# Patient Record
Sex: Female | Born: 1960
Health system: Southern US, Community
[De-identification: ages and names within clinical notes are randomized; demographics above are authoritative.]

## PROBLEM LIST (undated history)

## (undated) DIAGNOSIS — D649 Anemia, unspecified: Secondary | ICD-10-CM

## (undated) DIAGNOSIS — R609 Edema, unspecified: Secondary | ICD-10-CM

## (undated) DIAGNOSIS — F419 Anxiety disorder, unspecified: Secondary | ICD-10-CM

## (undated) DIAGNOSIS — R600 Localized edema: Secondary | ICD-10-CM

## (undated) DIAGNOSIS — E785 Hyperlipidemia, unspecified: Secondary | ICD-10-CM

## (undated) DIAGNOSIS — R7303 Prediabetes: Secondary | ICD-10-CM

## (undated) DIAGNOSIS — K219 Gastro-esophageal reflux disease without esophagitis: Secondary | ICD-10-CM

## (undated) DIAGNOSIS — I10 Essential (primary) hypertension: Secondary | ICD-10-CM

## (undated) DIAGNOSIS — F329 Major depressive disorder, single episode, unspecified: Secondary | ICD-10-CM

## (undated) DIAGNOSIS — F32A Depression, unspecified: Secondary | ICD-10-CM

## (undated) HISTORY — DX: Gastro-esophageal reflux disease without esophagitis: K21.9

## (undated) HISTORY — DX: Anxiety disorder, unspecified: F41.9

## (undated) HISTORY — DX: Edema, unspecified: R60.9

## (undated) HISTORY — DX: Prediabetes: R73.03

## (undated) HISTORY — DX: Depression, unspecified: F32.A

## (undated) HISTORY — DX: Essential (primary) hypertension: I10

## (undated) HISTORY — DX: Localized edema: R60.0

## (undated) HISTORY — DX: Hyperlipidemia, unspecified: E78.5

## (undated) HISTORY — DX: Major depressive disorder, single episode, unspecified: F32.9

## (undated) HISTORY — DX: Anemia, unspecified: D64.9

---

## 1998-12-19 ENCOUNTER — Other Ambulatory Visit: Admission: RE | Admit: 1998-12-19 | Discharge: 1998-12-19 | Payer: Self-pay | Admitting: Obstetrics & Gynecology

## 1999-06-07 ENCOUNTER — Ambulatory Visit (HOSPITAL_COMMUNITY): Admission: RE | Admit: 1999-06-07 | Discharge: 1999-06-07 | Payer: Self-pay | Admitting: *Deleted

## 1999-06-07 ENCOUNTER — Encounter: Payer: Self-pay | Admitting: *Deleted

## 1999-07-25 ENCOUNTER — Encounter: Payer: Self-pay | Admitting: *Deleted

## 1999-07-25 ENCOUNTER — Ambulatory Visit (HOSPITAL_COMMUNITY): Admission: RE | Admit: 1999-07-25 | Discharge: 1999-07-25 | Payer: Self-pay | Admitting: *Deleted

## 2004-05-23 ENCOUNTER — Other Ambulatory Visit: Admission: RE | Admit: 2004-05-23 | Discharge: 2004-05-23 | Payer: Self-pay | Admitting: Obstetrics & Gynecology

## 2004-10-04 ENCOUNTER — Inpatient Hospital Stay (HOSPITAL_COMMUNITY): Admission: EM | Admit: 2004-10-04 | Discharge: 2004-10-05 | Payer: Self-pay | Admitting: Family Medicine

## 2004-10-04 ENCOUNTER — Ambulatory Visit: Payer: Self-pay | Admitting: Cardiology

## 2011-04-11 ENCOUNTER — Inpatient Hospital Stay (HOSPITAL_COMMUNITY)
Admission: RE | Admit: 2011-04-11 | Discharge: 2011-04-15 | DRG: 430 | Disposition: A | Discharge status: Home / Self Care | Payer: BC Managed Care – PPO | Attending: Psychiatry | Admitting: Psychiatry

## 2011-04-11 DIAGNOSIS — F332 Major depressive disorder, recurrent severe without psychotic features: Principal | ICD-10-CM

## 2011-04-11 DIAGNOSIS — F411 Generalized anxiety disorder: Secondary | ICD-10-CM

## 2011-04-11 DIAGNOSIS — T424X4A Poisoning by benzodiazepines, undetermined, initial encounter: Secondary | ICD-10-CM

## 2011-04-11 DIAGNOSIS — Z79899 Other long term (current) drug therapy: Secondary | ICD-10-CM

## 2011-04-11 DIAGNOSIS — Z560 Unemployment, unspecified: Secondary | ICD-10-CM

## 2011-04-11 DIAGNOSIS — T394X2A Poisoning by antirheumatics, not elsewhere classified, intentional self-harm, initial encounter: Secondary | ICD-10-CM

## 2011-04-11 DIAGNOSIS — T40601A Poisoning by unspecified narcotics, accidental (unintentional), initial encounter: Secondary | ICD-10-CM

## 2011-04-11 DIAGNOSIS — Z880 Allergy status to penicillin: Secondary | ICD-10-CM

## 2011-04-11 DIAGNOSIS — I10 Essential (primary) hypertension: Secondary | ICD-10-CM

## 2011-04-11 DIAGNOSIS — T39314A Poisoning by propionic acid derivatives, undetermined, initial encounter: Secondary | ICD-10-CM

## 2011-04-11 DIAGNOSIS — K219 Gastro-esophageal reflux disease without esophagitis: Secondary | ICD-10-CM

## 2011-04-11 DIAGNOSIS — T43502A Poisoning by unspecified antipsychotics and neuroleptics, intentional self-harm, initial encounter: Secondary | ICD-10-CM

## 2011-04-12 DIAGNOSIS — F411 Generalized anxiety disorder: Secondary | ICD-10-CM

## 2011-04-12 DIAGNOSIS — F339 Major depressive disorder, recurrent, unspecified: Secondary | ICD-10-CM

## 2011-04-12 LAB — COMPREHENSIVE METABOLIC PANEL
ALT: 18 U/L (ref 0–35)
Albumin: 3.5 g/dL (ref 3.5–5.2)
Alkaline Phosphatase: 79 U/L (ref 39–117)
BUN: 13 mg/dL (ref 6–23)
Calcium: 9.3 mg/dL (ref 8.4–10.5)
Potassium: 4.5 mEq/L (ref 3.5–5.1)
Sodium: 141 mEq/L (ref 135–145)
Total Protein: 6.7 g/dL (ref 6.0–8.3)

## 2011-04-12 LAB — CBC
Platelets: 231 10*3/uL (ref 150–400)
RBC: 4.51 MIL/uL (ref 3.87–5.11)
RDW: 13.7 % (ref 11.5–15.5)
WBC: 7.1 10*3/uL (ref 4.0–10.5)

## 2011-04-12 LAB — DIFFERENTIAL
Basophils Relative: 1 % (ref 0–1)
Eosinophils Absolute: 0.2 10*3/uL (ref 0.0–0.7)
Neutrophils Relative %: 51 % (ref 43–77)

## 2011-04-12 LAB — ETHANOL: Alcohol, Ethyl (B): <11 mg/dL (ref 0–11)

## 2011-04-13 DIAGNOSIS — F411 Generalized anxiety disorder: Secondary | ICD-10-CM

## 2011-04-13 DIAGNOSIS — F339 Major depressive disorder, recurrent, unspecified: Secondary | ICD-10-CM

## 2011-04-13 LAB — ACETAMINOPHEN LEVEL: Acetaminophen (Tylenol), Serum: <15 ug/mL (ref 10–30)

## 2011-04-13 LAB — T3, FREE: T3, Free: 2.9 pg/mL (ref 2.3–4.2)

## 2011-04-15 ENCOUNTER — Inpatient Hospital Stay (INDEPENDENT_AMBULATORY_CARE_PROVIDER_SITE_OTHER)
Admission: RE | Admit: 2011-04-15 | Discharge: 2011-04-15 | Disposition: A | Discharge status: Home / Self Care | Payer: BC Managed Care – PPO | Source: Ambulatory Visit | Attending: Family Medicine | Admitting: Family Medicine

## 2011-04-15 ENCOUNTER — Encounter: Payer: Self-pay | Admitting: Family Medicine

## 2011-04-15 DIAGNOSIS — F3289 Other specified depressive episodes: Secondary | ICD-10-CM | POA: Insufficient documentation

## 2011-04-15 DIAGNOSIS — F329 Major depressive disorder, single episode, unspecified: Secondary | ICD-10-CM | POA: Insufficient documentation

## 2011-04-15 DIAGNOSIS — I10 Essential (primary) hypertension: Secondary | ICD-10-CM | POA: Insufficient documentation

## 2011-04-15 DIAGNOSIS — K219 Gastro-esophageal reflux disease without esophagitis: Secondary | ICD-10-CM | POA: Insufficient documentation

## 2011-04-15 DIAGNOSIS — L259 Unspecified contact dermatitis, unspecified cause: Secondary | ICD-10-CM

## 2011-04-15 DIAGNOSIS — L299 Pruritus, unspecified: Secondary | ICD-10-CM

## 2011-04-15 LAB — URINALYSIS, ROUTINE W REFLEX MICROSCOPIC
Glucose, UA: NEGATIVE mg/dL
Ketones, ur: NEGATIVE mg/dL
Leukocytes, UA: NEGATIVE
Nitrite: NEGATIVE
Protein, ur: NEGATIVE mg/dL

## 2011-04-15 LAB — PREGNANCY, URINE: Preg Test, Ur: NEGATIVE

## 2011-04-15 NOTE — Assessment & Plan Note (Signed)
NAME:  Alejandra Flores, Alejandra Flores NO.:  [id number]  MEDICAL RECORD NO.:  [id number]  LOCATION:  0505                          FACILITY:  [facility]  PHYSICIAN:  Franchot Gallo, MD     DATE OF BIRTH:  05-23-1961  DATE OF ADMISSION:  04/11/2011 DATE OF DISCHARGE:                      PSYCHIATRIC ADMISSION ASSESSMENT   IDENTIFYING INFORMATION:  This is a 50 year old female that was voluntarily admitted on April 12, 2011.  HISTORY OF PRESENT ILLNESS:  The patient was referred by her family doctor after the patient presented with having "crazy thoughts" in her head, reporting her mind racing and feeling exhausted.  The patient reports that she did overdose a couple of days back taking handfuls of Vicodin, Advil and Klonopin hoping not to wake up.  She states that what had happened, she woke up in her bed.  She states her husband did not take the overdose seriously.  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  She is still feeling suicidal and would feel unsafe at home.  She denies any psychotic symptoms.  She denies any substance use.  She was reporting feeling like she has a "good and bad [name]" with the bad [name] telling her to take pills.  PAST PSYCHIATRIC HISTORY:  First admission to [facility] She has had no other psychiatric admissions and is currently not in any outpatient therapy.  SOCIAL HISTORY:  She is a 50 year old married female.  She has 3 adult children.  She lives with her husband.  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  FAMILY HISTORY:  None.  ALCOHOL/DRUG HISTORY:  She denies any alcohol or substance use.  PRIMARY CARE PROVIDER:  Stacie Acres. Cliffton Asters, M.D. at [facility] physicians.  MEDICAL PROBLEMS: 1. History of hypertension. 2. GERD.  CURRENT MEDICATIONS:  From Dr. Lucilla Lame office list: 1. Estradiol 0.5 mg daily. 2.  Cymbalta 60 mg daily. 3. Klonopin 1 mg one-half to one tablet nightly p.r.n. 4. Lisinopril 10 mg daily. 5. Nexium 40 mg one daily. 6. Zegerid 40 mg on an empty stomach. 7. Amitriptyline 25 mg one nightly. 8. Vitamin D 50,000 units weekly.  ALLERGIES: 1. PENICILLIN with reaction of a rash. 2. PERCOCET with reaction of a rash, anaphylactic with morphine and     Dilaudid.  PHYSICAL EXAMINATION:  This is a well-nourished female in no acute distress.  She offers no complaints.  LABORATORY DATA:  No labs are available at this time.  MENTAL STATUS EXAM:  The patient is fully alert and cooperative.  She is dressed in her own clothing.  She is casually dressed.  She has good eye contact.  Her speech is clear, not rapid, not pressured.  She is very open about her circumstances that surrounded this admission.  When asked about the suicidal thoughts, she states "not really."  She promises safety on the unit, but definitely states she would not be safe at this time going home.  She is somewhat anxious.  Denies any psychotic symptoms and does not appear to be actively responding to any internal stimuli.  Cognitive function intact.  Judgment and insight are fair.  AXIS I:  Major depressive disorder, recurrent, severe. AXIS II:  Deferred. AXIS III:  History of hypertension.  Gastroesophageal reflux disease. AXIS IV:  Problems with occupation, legal system, other psychosocial problems. AXIS V:  Current is 30.  PLAN:  Our plan is to obtain labs especially in consideration of the recent overdose.  We will continue with her Cymbalta, her Nexium and her lisinopril.  We will also continue with her amitriptyline.  Increase her Cymbalta and add Trileptal.  We will contact husband for any safety issues at home.  The patient may benefit from some individual therapy as she also reports problems with marital discord at home and coping with her recent termination of her employment.  Her tentative length  of stay at this time is 4-5 days.     Landry Corporal, N.P.   ______________________________ Franchot Gallo, MD    JO/MEDQ  D:  04/12/2011  T:  04/12/2011  Job:  [id number]  Electronically Signed by Limmie PatriciaP. on 04/15/2011 09:22:26 AM Electronically Signed by Franchot Gallo MD on 04/15/2011 05:28:45 PM

## 2011-04-16 LAB — URINE DRUGS OF ABUSE SCREEN W ALC, ROUTINE (REF LAB)
Barbiturate Quant, Ur: NEGATIVE
Benzodiazepines.: NEGATIVE
Cocaine Metabolites: NEGATIVE
Creatinine,U: 25.9 mg/dL
Ethyl Alcohol: <10 mg/dL (ref <10)
Opiate Screen, Urine: NEGATIVE
Phencyclidine (PCP): NEGATIVE

## 2011-04-17 NOTE — Discharge Summary (Signed)
NAME:  Alejandra Flores, Alejandra Flores NO.:  [id number]  MEDICAL RECORD NO.:  [id number]  LOCATION:  0505                          FACILITY:  [facility]  PHYSICIAN:  Franchot Gallo, MD     DATE OF BIRTH:  [birth year]  DATE OF ADMISSION:  04/11/2011 DATE OF DISCHARGE:  04/15/2011                              DISCHARGE SUMMARY   REASON FOR ADMISSION:  This is a 50 year old female that was admitted after the patient was referred by her family doctor.  She was reporting her mind racing, feeling exhausted, and she reported an overdose a couple days prior taking handfuls of Vicodin, Advil and Klonopin hoping not to wake.  FINAL IMPRESSION:  Axis I: 1. Major depressive disorder, recurrent, mild. 2. Generalized anxiety disorder, mild. Axis II:  Deferred. Axis III:  Gastroesophageal reflux disease and hypertension. Axis IV:  Family conflict, occupational issues. Axis V:  GAF at discharge is 70.  SIGNIFICANT FINDINGS:  The patient was admitted to the adult milieu for safety and stabilization.  We continued with her Cymbalta, Nexium and lisinopril and had her amitriptyline available at bedtime.  We were going to increase her Cymbalta to lessen her depressive symptoms and add a mood stabilizer, Trileptal.  The patient was reporting problems with sleep, having frequent awakenings, decreased appetite, but that was improving.  She was reporting severe depressive symptoms rating it a 10 on a scale of 1-10 and her hopelessness a 10 on a scale of 1-10.  She was having severe anxiety rating it an 8 and having no medication side effects.  She was beginning to feel better.  She was having no thoughts of any self-harm and denied any psychotic symptoms, feeling more like herself.  She was participating in groups.  She talked about her suicide thinking due to her job loss and other personal stressors.  Her husband had attended the family forum, and her sister stated that she would be a support to her  when the patient was discharged.  We did have contact with Rocky Link, the patient's husband, to assess any safety issues and for [place] to provide suicide prevention, intervention and risk factors. On the day of discharge, the patient reported it was difficult to fall asleep.  Her appetite was less.  She was having mild depressive symptoms rating it a 2 on a scale of 1-10.  She denied any auditory hallucinations or delusional thinking, rating her anxiety mild, a 2 on a scale of 1-10.  DISCHARGE MEDICATIONS:  Her discharge medications included: 1. Cymbalta, taking 2 in the morning and 1 at 2 o'clock. 2. Trileptal 300 mg, 1 b.i.d. 3. Amitriptyline 25 mg nightly. 4. Estrace 0.5 mg 1 daily. 5. Lisinopril 10 mg daily. 6. Nexium 40 mg daily.  FOLLOWUP:  Her follow-up appointment was at [facility], phone number [phone].  The patient will be receiving a phone call from the case manager at home for her follow-up appointment.     Landry Corporal, N.P.   ______________________________ Franchot Gallo, MD  JO/MEDQ  D:  04/15/2011  T:  04/15/2011  Job:  [id number]  Electronically Signed by Limmie PatriciaP. on 04/16/2011 09:55:00 AM Electronically Signed  by Franchot Gallo MD on 04/17/2011 01:19:10 PM

## 2011-07-15 NOTE — Progress Notes (Signed)
Summary: ITCHING rash (room 4)   Vital Signs:  Patient Profile:   50 Years Old Female CC:      scattered rash Height:     60 inches Weight:      218 pounds O2 Sat:      100 % O2 treatment:    Room Air Temp:     98.4 degrees F oral Pulse rate:   104 / minute Resp:     16 per minute BP sitting:   103 / 68  (left arm) Cuff size:   large  Pt. in pain?   no  Vitals Entered By: Lavell Islam RN (April 15, 2011 4:13 PM)                   Updated Prior Medication List: CYMBALTA 30 MG CPEP (DULOXETINE HCL) daily TRILEPTAL 300 MG TABS (OXCARBAZEPINE) daily AMITRIPTYLINE HCL 25 MG TABS (AMITRIPTYLINE HCL) daily ESTRACE 0.5 MG TABS (ESTRADIOL) daily PRINIVIL 10 MG TABS (LISINOPRIL) daily NEXIUM 20 MG CPDR (ESOMEPRAZOLE MAGNESIUM) daily * BENADRYL 50MG  every 4 hours for rash/itching  Current Allergies: ! PENICILLIN ! * HYDROMORPHONE ! MORPHINE ! * OXYCODONE/ASAHistory of Present Illness Chief Complaint: scattered rash History of Present Illness:  Subjective:  Patient complains of onset of pruritic rash on her right thigh about one week ago after her dog pawed the area.  The dog had been playing in the neighbor's yard where poison ivy is located.  The rash has persisted without improvement from 1% HC cream.  Over the past two days she has developed mild erythema on her upper chest and itching in scalp and neck.  She had chills last night.  She feels well otherwise.  REVIEW OF SYSTEMS Constitutional Symptoms       Complains of fatigue.     Denies fever, chills, night sweats, weight loss, and weight gain.  Eyes       Denies change in vision, eye pain, eye discharge, glasses, contact lenses, and eye surgery. Ear/Nose/Throat/Mouth       Complains of ear pain.      Denies hearing loss/aids, change in hearing, ear discharge, dizziness, frequent runny nose, frequent nose bleeds, sinus problems, sore throat, hoarseness, and tooth pain or bleeding.  Respiratory       Denies dry  cough, productive cough, wheezing, shortness of breath, asthma, bronchitis, and emphysema/COPD.  Cardiovascular       Denies murmurs, chest pain, and tires easily with exhertion.    Gastrointestinal       Denies stomach pain, nausea/vomiting, diarrhea, constipation, blood in bowel movements, and indigestion. Genitourniary       Denies painful urination, kidney stones, and loss of urinary control. Neurological       Denies paralysis, seizures, and fainting/blackouts. Musculoskeletal       Complains of redness and swelling.      Denies muscle pain, joint pain, joint stiffness, decreased range of motion, muscle weakness, and gout.  Skin       Denies bruising, unusual mles/lumps or sores, and hair/skin or nail changes.  Psych       Complains of depression.      Denies mood changes, temper/anger issues, anxiety/stress, speech problems, and sleep problems. Other Comments: scattered rash; just [facility]   Past History:  Past Medical History: Depression GERD Hypertension  Past Surgical History: ankle Hysterectomy/partial Cholecystectomy  Family History: unremarkable  Social History: married Never Smoked Alcohol use-yes Drug use-no Smoking Status:  never Drug Use:  no   Objective:  Appearance:  Patient is obese but otherwise appears healthy, stated age, and in no acute distress  Eyes:  Pupils are equal, round, and reactive to light and accomodation.  Extraocular movement is intact.  Conjunctivae are not inflamed.  Ears:  Canals normal.  Tympanic membranes normal.   Nose:  Mildly congested turbinates.  No sinus tenderness  Mouth/pharynx:  No lesions Neck:  Supple.  No adenopathy is present.  Lungs:  Clear to auscultation.  Breath sounds are equal.  Heart:  Regular rate and rhythm without murmurs, rubs, or gallops.  Abdomen:  Nontender without masses or hepatosplenomegaly.  Bowel sounds are present.  No CVA or flank tenderness.  Skin:  Right lateral  thigh reveals several distinct linear erythematous slightly raised lesions about 15cm long that suggest scratches from a dog's paw.  No vesicles or swelling.  On the upper exposed chest on light exposed area is faint confluent erythema.  There are several scattered erythematous macular patches on neck. CBC:  WBC 9.6 ; LY 21.7, MO 2.4, GR 75.9; Hgb 12.3  Assessment New Problems: PRURITUS (ICD-698.9) CONTACT DERMATITIS (ICD-692.9) HYPERTENSION (ICD-401.9) GERD (ICD-530.81) DEPRESSION (ICD-311)  SUSPECT A CONTACT DERMATITIS WITH SECONDARY BACTERIAL INFECTION  Plan New Medications/Changes: CEPHALEXIN 500 MG CAPS (CEPHALEXIN) One by mouth two times a day  #14 x 0, 04/15/2011, Donna Christen MD DEXPAK 6 DAY 1.5 MG TABS (DEXAMETHASONE) Take as directed.  Start Tuesday  #1 x 0, 04/15/2011, Donna Christen MD  New Orders: CBC w/Diff [[id number]] Depo- Medrol 80mg  [J1040] Admin of Therapeutic Inj  intramuscular or subcutaneous [96372] New Patient Level IV [99204] Services provided After hours-Weekends-Holidays [99051] Planning Comments:   Depo Medrol 80mg  IM.  Begin 6 day DexPak tomorrow.  Begin Keflex.  May continue Benadryl at bedtime for itching. Follow-up with dermatologist if not improving, or if symptoms worsen   The patient and/or caregiver has been counseled thoroughly with regard to medications prescribed including dosage, schedule, interactions, rationale for use, and possible side effects and they verbalize understanding.  Diagnoses and expected course of recovery discussed and will return if not improved as expected or if the condition worsens. Patient and/or caregiver verbalized understanding.  Prescriptions: CEPHALEXIN 500 MG CAPS (CEPHALEXIN) One by mouth two times a day  #14 x 0   Entered and Authorized by:   Donna Christen MD   Signed by:   Donna Christen MD on 04/15/2011   Method used:   Print then Give to Patient   RxID:   [id number] DEXPAK 6 DAY 1.5 MG TABS  (DEXAMETHASONE) Take as directed.  Start Tuesday  #1 x 0   Entered and Authorized by:   Donna Christen MD   Signed by:   Donna Christen MD on 04/15/2011   Method used:   Print then Give to Patient   RxID:   [id number]   Medication Administration  Injection # 1:    Medication: Depo- Medrol 80mg     Diagnosis: PRURITUS (ICD-698.9)    Route: IM    Site: RUOQ gluteus    Exp Date: 07/2011    Lot #: [id number]    Mfr: novaplus    Patient tolerated injection without complications    Given by: Lavell Islam RN (April 15, 2011 5:04 PM)  Orders Added: 1)  CBC w/Diff [[id number]] 2)  Depo- Medrol 80mg  [J1040] 3)  Admin of Therapeutic Inj  intramuscular or subcutaneous [96372] 4)  New Patient Level IV [99204] 5)  Services provided After hours-Weekends-Holidays [72536]

## 2013-11-23 ENCOUNTER — Ambulatory Visit: Payer: BC Managed Care – PPO | Admitting: *Deleted

## 2013-11-23 ENCOUNTER — Encounter: Payer: Self-pay | Admitting: *Deleted

## 2013-11-23 ENCOUNTER — Encounter: Payer: BC Managed Care – PPO | Attending: Family Medicine | Admitting: *Deleted

## 2013-11-23 VITALS — Ht 60.0 in | Wt 228.4 lb

## 2013-11-23 DIAGNOSIS — F329 Major depressive disorder, single episode, unspecified: Secondary | ICD-10-CM | POA: Insufficient documentation

## 2013-11-23 DIAGNOSIS — F3289 Other specified depressive episodes: Secondary | ICD-10-CM | POA: Insufficient documentation

## 2013-11-23 DIAGNOSIS — E119 Type 2 diabetes mellitus without complications: Secondary | ICD-10-CM | POA: Insufficient documentation

## 2013-11-23 DIAGNOSIS — Z713 Dietary counseling and surveillance: Secondary | ICD-10-CM | POA: Insufficient documentation

## 2013-11-23 NOTE — Patient Instructions (Addendum)
Plan:  Aim for 2-3 Carb Choices per meal (30-45 grams) +/- 1 either way  Aim for 0-15 Carbs per snack if hungry  Include protein in moderation with your meals and snacks Consider reading food labels for Total Carbohydrate and Fat Grams of foods Consider  increasing your activity level by walking for 30 minutes daily as tolerated Continue checking BG  per day as directed by MD   Consider [facility] Protein Bars Consider [place] Bacon Consider reduced calorie bread (45-50cal) for less carbohydrates Alejandra Flores(Alejandra Flores, [place])  Have protein with all carbohydrates

## 2013-11-23 NOTE — Progress Notes (Signed)
Appt start time: 1100 end time:  1230.  Assessment:  Patient was seen on  11/23/13 for individual diabetes education. This is a new diagnosis 07/23/13. She has had a 8# weight loss since diagnosis. Alejandra Flores has a diagnosis of depression which is being treatment and managed effectively.  Current HbA1c: 6.7%  Preferred Learning Style:   Auditory  Visual  Hands on  Learning Readiness:   Change in progress  MEDICATIONS: None for diabetes at this time  Usual physical activity: walking  Intervention:  Nutrition counseling provided.  Discussed diabetes disease process and treatment options.  Discussed physiology of diabetes and role of obesity on insulin resistance.  Encouraged moderate weight reduction to improve glucose levels.  Discussed role of medications and diet in glucose control  Provided education on macronutrients on glucose levels.  Provided education on carb counting, importance of regularly scheduled meals/snacks, and meal planning  Discussed effects of physical activity on glucose levels and long-term glucose control.  Recommended 150 minutes of physical activity/week.  Reviewed patient medications.  Discussed role of medication on blood glucose and possible side effects  Discussed blood glucose monitoring and interpretation.  Discussed recommended target ranges and individual ranges.    Described short-term complications: hyper- and hypo-glycemia.  Discussed causes,symptoms, and treatment options.  Discussed prevention, detection, and treatment of long-term complications.  Discussed the role of prolonged elevated glucose levels on body systems.  Discussed role of stress on blood glucose levels and discussed strategies to manage psychosocial issues.  Discussed recommendations for long-term diabetes self-care.  Established checklist for medical, dental, and emotional self-care.  Plan:  Aim for 2-3 Carb Choices per meal (30-45 grams) +/- 1 either way  Aim for 0-15 Carbs  per snack if hungry  Include protein in moderation with your meals and snacks Consider reading food labels for Total Carbohydrate and Fat Grams of foods Consider  increasing your activity level by walking for 30 minutes daily as tolerated Continue checking BG  per day as directed by MD   Consider [facility] Protein Bars Consider Alejandra Flores Consider reduced calorie bread (45-50cal) for less carbohydrates Alejandra Flores, [place])  Have protein with all carbohydrates  Teaching Method Utilized:  Visual Auditory Hands on  Handouts given during visit include: Living Well with Diabetes Carb Counting  Meal Plan Card Snack sheet  Barriers to learning/adherence to lifestyle change: Depression diagnosis  Diabetes self-care support plan:   Alejandra Flores support group  PCP  Daughter-in-law  Demonstrated degree of understanding via:  Teach Back   Monitoring/Evaluation:  Dietary intake, exercise, test glucose, and body weight prn.

## 2016-07-18 ENCOUNTER — Other Ambulatory Visit: Payer: Self-pay | Admitting: Orthopedic Surgery

## 2016-08-08 ENCOUNTER — Ambulatory Visit: Admit: 2016-08-08 | Payer: Self-pay | Admitting: Orthopedic Surgery

## 2016-08-08 SURGERY — ANTERIOR CERVICAL DECOMPRESSION/DISCECTOMY FUSION 3 LEVELS
Anesthesia: General

## 2018-10-22 DIAGNOSIS — R69 Illness, unspecified: Secondary | ICD-10-CM | POA: Diagnosis not present

## 2018-11-14 DIAGNOSIS — G8929 Other chronic pain: Secondary | ICD-10-CM | POA: Diagnosis not present

## 2018-11-14 DIAGNOSIS — R269 Unspecified abnormalities of gait and mobility: Secondary | ICD-10-CM | POA: Diagnosis not present

## 2018-11-14 DIAGNOSIS — M47892 Other spondylosis, cervical region: Secondary | ICD-10-CM | POA: Diagnosis not present

## 2018-11-14 DIAGNOSIS — M5441 Lumbago with sciatica, right side: Secondary | ICD-10-CM | POA: Diagnosis not present

## 2018-11-14 DIAGNOSIS — M545 Low back pain: Secondary | ICD-10-CM | POA: Diagnosis not present

## 2018-11-14 DIAGNOSIS — M542 Cervicalgia: Secondary | ICD-10-CM | POA: Diagnosis not present

## 2018-11-18 DIAGNOSIS — M4802 Spinal stenosis, cervical region: Secondary | ICD-10-CM | POA: Diagnosis not present

## 2018-11-18 DIAGNOSIS — M542 Cervicalgia: Secondary | ICD-10-CM | POA: Diagnosis not present

## 2018-11-18 DIAGNOSIS — M9983 Other biomechanical lesions of lumbar region: Secondary | ICD-10-CM | POA: Diagnosis not present

## 2018-11-19 DIAGNOSIS — E782 Mixed hyperlipidemia: Secondary | ICD-10-CM | POA: Diagnosis not present

## 2018-11-19 DIAGNOSIS — E119 Type 2 diabetes mellitus without complications: Secondary | ICD-10-CM | POA: Diagnosis not present

## 2018-11-27 DIAGNOSIS — M4712 Other spondylosis with myelopathy, cervical region: Secondary | ICD-10-CM | POA: Diagnosis not present

## 2018-11-27 DIAGNOSIS — M542 Cervicalgia: Secondary | ICD-10-CM | POA: Diagnosis not present

## 2018-11-27 DIAGNOSIS — M9983 Other biomechanical lesions of lumbar region: Secondary | ICD-10-CM | POA: Diagnosis not present

## 2018-11-27 DIAGNOSIS — M5417 Radiculopathy, lumbosacral region: Secondary | ICD-10-CM | POA: Diagnosis not present

## 2018-11-27 DIAGNOSIS — M533 Sacrococcygeal disorders, not elsewhere classified: Secondary | ICD-10-CM | POA: Diagnosis not present

## 2018-11-27 DIAGNOSIS — M4802 Spinal stenosis, cervical region: Secondary | ICD-10-CM | POA: Diagnosis not present

## 2018-12-02 DIAGNOSIS — D720 Genetic anomalies of leukocytes: Secondary | ICD-10-CM | POA: Diagnosis not present

## 2018-12-02 DIAGNOSIS — D729 Disorder of white blood cells, unspecified: Secondary | ICD-10-CM | POA: Diagnosis not present

## 2018-12-02 DIAGNOSIS — D72829 Elevated white blood cell count, unspecified: Secondary | ICD-10-CM | POA: Diagnosis not present

## 2018-12-02 DIAGNOSIS — M479 Spondylosis, unspecified: Secondary | ICD-10-CM | POA: Diagnosis not present

## 2018-12-02 DIAGNOSIS — Z791 Long term (current) use of non-steroidal anti-inflammatories (NSAID): Secondary | ICD-10-CM | POA: Diagnosis not present

## 2018-12-30 DIAGNOSIS — M545 Low back pain: Secondary | ICD-10-CM | POA: Diagnosis not present

## 2018-12-30 DIAGNOSIS — G8929 Other chronic pain: Secondary | ICD-10-CM | POA: Diagnosis not present

## 2018-12-30 DIAGNOSIS — M5416 Radiculopathy, lumbar region: Secondary | ICD-10-CM | POA: Diagnosis not present

## 2018-12-30 DIAGNOSIS — M5417 Radiculopathy, lumbosacral region: Secondary | ICD-10-CM | POA: Diagnosis not present

## 2019-01-22 DIAGNOSIS — R69 Illness, unspecified: Secondary | ICD-10-CM | POA: Diagnosis not present

## 2019-02-24 DIAGNOSIS — M5126 Other intervertebral disc displacement, lumbar region: Secondary | ICD-10-CM | POA: Diagnosis not present

## 2019-04-22 DIAGNOSIS — G4733 Obstructive sleep apnea (adult) (pediatric): Secondary | ICD-10-CM | POA: Diagnosis not present

## 2019-05-05 DIAGNOSIS — M5416 Radiculopathy, lumbar region: Secondary | ICD-10-CM | POA: Diagnosis not present

## 2019-06-04 DIAGNOSIS — M5136 Other intervertebral disc degeneration, lumbar region: Secondary | ICD-10-CM | POA: Diagnosis not present

## 2019-06-04 DIAGNOSIS — Z6841 Body Mass Index (BMI) 40.0 and over, adult: Secondary | ICD-10-CM | POA: Diagnosis not present

## 2019-06-25 DIAGNOSIS — M5126 Other intervertebral disc displacement, lumbar region: Secondary | ICD-10-CM | POA: Diagnosis not present

## 2019-06-25 DIAGNOSIS — M4807 Spinal stenosis, lumbosacral region: Secondary | ICD-10-CM | POA: Diagnosis not present

## 2019-06-25 DIAGNOSIS — M5127 Other intervertebral disc displacement, lumbosacral region: Secondary | ICD-10-CM | POA: Diagnosis not present

## 2019-07-02 DIAGNOSIS — M533 Sacrococcygeal disorders, not elsewhere classified: Secondary | ICD-10-CM | POA: Diagnosis not present

## 2019-07-02 DIAGNOSIS — Z6841 Body Mass Index (BMI) 40.0 and over, adult: Secondary | ICD-10-CM | POA: Diagnosis not present

## 2019-07-05 ENCOUNTER — Other Ambulatory Visit: Payer: Self-pay | Admitting: Orthopedic Surgery

## 2019-07-05 DIAGNOSIS — M533 Sacrococcygeal disorders, not elsewhere classified: Secondary | ICD-10-CM

## 2019-07-15 ENCOUNTER — Other Ambulatory Visit: Payer: Self-pay

## 2019-07-16 ENCOUNTER — Ambulatory Visit
Admission: RE | Admit: 2019-07-16 | Discharge: 2019-07-16 | Disposition: A | Discharge status: Home / Self Care | Payer: 59 | Source: Ambulatory Visit | Attending: Orthopedic Surgery | Admitting: Orthopedic Surgery

## 2019-07-16 DIAGNOSIS — R102 Pelvic and perineal pain: Secondary | ICD-10-CM | POA: Diagnosis not present

## 2019-07-16 DIAGNOSIS — M533 Sacrococcygeal disorders, not elsewhere classified: Secondary | ICD-10-CM

## 2019-07-23 DIAGNOSIS — M533 Sacrococcygeal disorders, not elsewhere classified: Secondary | ICD-10-CM | POA: Diagnosis not present

## 2019-07-23 DIAGNOSIS — Z6841 Body Mass Index (BMI) 40.0 and over, adult: Secondary | ICD-10-CM | POA: Diagnosis not present

## 2019-08-02 DIAGNOSIS — M47818 Spondylosis without myelopathy or radiculopathy, sacral and sacrococcygeal region: Secondary | ICD-10-CM | POA: Diagnosis not present

## 2019-08-02 DIAGNOSIS — M47816 Spondylosis without myelopathy or radiculopathy, lumbar region: Secondary | ICD-10-CM | POA: Diagnosis not present

## 2019-08-16 DIAGNOSIS — M533 Sacrococcygeal disorders, not elsewhere classified: Secondary | ICD-10-CM | POA: Diagnosis not present

## 2019-08-18 DIAGNOSIS — G4733 Obstructive sleep apnea (adult) (pediatric): Secondary | ICD-10-CM | POA: Diagnosis not present

## 2019-09-14 DIAGNOSIS — M461 Sacroiliitis, not elsewhere classified: Secondary | ICD-10-CM | POA: Diagnosis not present

## 2019-10-08 DIAGNOSIS — Z9889 Other specified postprocedural states: Secondary | ICD-10-CM | POA: Diagnosis not present

## 2019-10-08 DIAGNOSIS — M533 Sacrococcygeal disorders, not elsewhere classified: Secondary | ICD-10-CM | POA: Diagnosis not present

## 2019-10-15 DIAGNOSIS — M545 Low back pain: Secondary | ICD-10-CM | POA: Diagnosis not present

## 2019-10-15 DIAGNOSIS — R262 Difficulty in walking, not elsewhere classified: Secondary | ICD-10-CM | POA: Diagnosis not present

## 2019-10-18 DIAGNOSIS — M545 Low back pain: Secondary | ICD-10-CM | POA: Diagnosis not present

## 2019-10-18 DIAGNOSIS — R262 Difficulty in walking, not elsewhere classified: Secondary | ICD-10-CM | POA: Diagnosis not present

## 2019-10-22 DIAGNOSIS — M545 Low back pain: Secondary | ICD-10-CM | POA: Diagnosis not present

## 2019-10-22 DIAGNOSIS — R262 Difficulty in walking, not elsewhere classified: Secondary | ICD-10-CM | POA: Diagnosis not present

## 2019-10-25 DIAGNOSIS — R262 Difficulty in walking, not elsewhere classified: Secondary | ICD-10-CM | POA: Diagnosis not present

## 2019-10-25 DIAGNOSIS — M545 Low back pain: Secondary | ICD-10-CM | POA: Diagnosis not present

## 2019-10-28 DIAGNOSIS — R262 Difficulty in walking, not elsewhere classified: Secondary | ICD-10-CM | POA: Diagnosis not present

## 2019-10-28 DIAGNOSIS — M545 Low back pain: Secondary | ICD-10-CM | POA: Diagnosis not present

## 2019-10-29 DIAGNOSIS — Z9889 Other specified postprocedural states: Secondary | ICD-10-CM | POA: Diagnosis not present

## 2019-10-29 DIAGNOSIS — M533 Sacrococcygeal disorders, not elsewhere classified: Secondary | ICD-10-CM | POA: Diagnosis not present

## 2019-11-02 DIAGNOSIS — R262 Difficulty in walking, not elsewhere classified: Secondary | ICD-10-CM | POA: Diagnosis not present

## 2019-11-02 DIAGNOSIS — M545 Low back pain: Secondary | ICD-10-CM | POA: Diagnosis not present

## 2019-11-03 DIAGNOSIS — M722 Plantar fascial fibromatosis: Secondary | ICD-10-CM | POA: Diagnosis not present

## 2019-11-04 DIAGNOSIS — M545 Low back pain: Secondary | ICD-10-CM | POA: Diagnosis not present

## 2019-11-04 DIAGNOSIS — R262 Difficulty in walking, not elsewhere classified: Secondary | ICD-10-CM | POA: Diagnosis not present

## 2019-11-17 DIAGNOSIS — M545 Low back pain: Secondary | ICD-10-CM | POA: Diagnosis not present

## 2019-11-17 DIAGNOSIS — R262 Difficulty in walking, not elsewhere classified: Secondary | ICD-10-CM | POA: Diagnosis not present

## 2019-11-18 DIAGNOSIS — G4733 Obstructive sleep apnea (adult) (pediatric): Secondary | ICD-10-CM | POA: Diagnosis not present

## 2019-11-19 DIAGNOSIS — R262 Difficulty in walking, not elsewhere classified: Secondary | ICD-10-CM | POA: Diagnosis not present

## 2019-11-19 DIAGNOSIS — M545 Low back pain: Secondary | ICD-10-CM | POA: Diagnosis not present

## 2019-11-22 DIAGNOSIS — M545 Low back pain: Secondary | ICD-10-CM | POA: Diagnosis not present

## 2019-11-22 DIAGNOSIS — R262 Difficulty in walking, not elsewhere classified: Secondary | ICD-10-CM | POA: Diagnosis not present

## 2019-11-24 DIAGNOSIS — M545 Low back pain: Secondary | ICD-10-CM | POA: Diagnosis not present

## 2019-11-24 DIAGNOSIS — R262 Difficulty in walking, not elsewhere classified: Secondary | ICD-10-CM | POA: Diagnosis not present

## 2019-11-29 DIAGNOSIS — R262 Difficulty in walking, not elsewhere classified: Secondary | ICD-10-CM | POA: Diagnosis not present

## 2019-11-29 DIAGNOSIS — M545 Low back pain: Secondary | ICD-10-CM | POA: Diagnosis not present

## 2019-11-30 DIAGNOSIS — H40053 Ocular hypertension, bilateral: Secondary | ICD-10-CM | POA: Diagnosis not present

## 2019-11-30 DIAGNOSIS — E119 Type 2 diabetes mellitus without complications: Secondary | ICD-10-CM | POA: Diagnosis not present

## 2019-11-30 DIAGNOSIS — H2513 Age-related nuclear cataract, bilateral: Secondary | ICD-10-CM | POA: Diagnosis not present

## 2019-12-01 DIAGNOSIS — R262 Difficulty in walking, not elsewhere classified: Secondary | ICD-10-CM | POA: Diagnosis not present

## 2019-12-01 DIAGNOSIS — M545 Low back pain: Secondary | ICD-10-CM | POA: Diagnosis not present

## 2019-12-10 DIAGNOSIS — M533 Sacrococcygeal disorders, not elsewhere classified: Secondary | ICD-10-CM | POA: Diagnosis not present

## 2020-02-24 DIAGNOSIS — E782 Mixed hyperlipidemia: Secondary | ICD-10-CM | POA: Diagnosis not present

## 2020-02-24 DIAGNOSIS — I10 Essential (primary) hypertension: Secondary | ICD-10-CM | POA: Diagnosis not present

## 2020-02-24 DIAGNOSIS — E119 Type 2 diabetes mellitus without complications: Secondary | ICD-10-CM | POA: Diagnosis not present

## 2020-03-24 DIAGNOSIS — R69 Illness, unspecified: Secondary | ICD-10-CM | POA: Diagnosis not present

## 2020-03-24 DIAGNOSIS — Z79899 Other long term (current) drug therapy: Secondary | ICD-10-CM | POA: Diagnosis not present

## 2020-04-24 DIAGNOSIS — M47816 Spondylosis without myelopathy or radiculopathy, lumbar region: Secondary | ICD-10-CM | POA: Diagnosis not present

## 2020-04-24 DIAGNOSIS — M5136 Other intervertebral disc degeneration, lumbar region: Secondary | ICD-10-CM | POA: Diagnosis not present

## 2020-04-24 DIAGNOSIS — M533 Sacrococcygeal disorders, not elsewhere classified: Secondary | ICD-10-CM | POA: Diagnosis not present

## 2020-04-24 DIAGNOSIS — G4733 Obstructive sleep apnea (adult) (pediatric): Secondary | ICD-10-CM | POA: Diagnosis not present

## 2020-04-24 DIAGNOSIS — Z9989 Dependence on other enabling machines and devices: Secondary | ICD-10-CM | POA: Diagnosis not present

## 2020-04-24 DIAGNOSIS — M5416 Radiculopathy, lumbar region: Secondary | ICD-10-CM | POA: Diagnosis not present

## 2020-05-21 ENCOUNTER — Telehealth (HOSPITAL_COMMUNITY): Payer: Self-pay | Admitting: Nurse Practitioner

## 2020-05-21 DIAGNOSIS — U071 COVID-19: Secondary | ICD-10-CM

## 2020-05-21 NOTE — Telephone Encounter (Signed)
Called to Discuss with patient about Covid symptoms and the use of regeneron, a monoclonal antibody infusion for those with mild to moderate Covid symptoms and at a high risk of hospitalization.     Pt is qualified for this infusion at the [facility] due to co-morbid conditions and/or a member of an at-risk group.     Unable to reach pt. Left message to return call  Kaceton Vieau, DNP, AGNP-C [phone] (Infusion Center Hotline)  

## 2020-05-22 ENCOUNTER — Ambulatory Visit (HOSPITAL_COMMUNITY): Payer: No Typology Code available for payment source

## 2020-05-23 ENCOUNTER — Ambulatory Visit (HOSPITAL_COMMUNITY)
Admission: RE | Admit: 2020-05-23 | Discharge: 2020-05-23 | Disposition: A | Discharge status: Home / Self Care | Payer: No Typology Code available for payment source | Source: Ambulatory Visit | Attending: Pulmonary Disease | Admitting: Pulmonary Disease

## 2020-05-23 DIAGNOSIS — U071 COVID-19: Secondary | ICD-10-CM | POA: Diagnosis not present

## 2020-05-23 MED ORDER — ALBUTEROL SULFATE HFA 108 (90 BASE) MCG/ACT IN AERS: 2.0000 | INHALATION_SPRAY | Freq: Once | RESPIRATORY_TRACT | Status: DC | PRN

## 2020-05-23 MED ORDER — METHYLPREDNISOLONE SODIUM SUCC 125 MG IJ SOLR: 125.0000 mg | Freq: Once | INTRAMUSCULAR | Status: DC | PRN

## 2020-05-23 MED ORDER — DIPHENHYDRAMINE HCL 50 MG/ML IJ SOLN: 50.0000 mg | Freq: Once | INTRAMUSCULAR | Status: DC | PRN

## 2020-05-23 MED ORDER — EPINEPHRINE 0.3 MG/0.3ML IJ SOAJ: 0.3000 mg | Freq: Once | INTRAMUSCULAR | Status: DC | PRN

## 2020-05-23 MED ORDER — FAMOTIDINE IN NACL 20-0.9 MG/50ML-% IV SOLN: 20.0000 mg | Freq: Once | INTRAVENOUS | Status: DC | PRN

## 2020-05-23 MED ORDER — SODIUM CHLORIDE 0.9 % IV SOLN: INTRAVENOUS | Status: DC | PRN

## 2020-05-23 MED ORDER — SODIUM CHLORIDE 0.9 % IV SOLN
Freq: Once | INTRAVENOUS | Status: AC
  Filled 2020-05-23: qty 20

## 2020-05-23 NOTE — Discharge Instructions (Signed)

## 2020-05-23 NOTE — Progress Notes (Signed)
  Diagnosis: COVID-19  Physician: Patrick Wright, MD  Procedure: Cov[facility] Infusion Clinic Med: bamlanivimab\etesevimab infusion - Provided patient with bamlanimivab\etesevimab fact sheet for patients, parents and caregivers prior to infusion.  Complications: No immediate complications noted.  Discharge: Discharged home   Elonzo Sopp N Rogina Schiano 05/23/2020   

## 2020-05-23 NOTE — Progress Notes (Signed)
I connected by phone with Alejandra Flores on 05/23/2020 at 8:32 AM to discuss the potential use of a new treatment for mild to moderate COVID-19 viral infection in non-hospitalized patients.  This patient is a 59 y.o. female that meets the FDA criteria for Emergency Use Authorization of COVID monoclonal antibody casirivimab/imdevimab or bamlanivimab/eteseviamb.  Has a (+) direct SARS-CoV-2 viral test result  Has mild or moderate COVID-19   Is NOT hospitalized due to COVID-19  Is within 10 days of symptom onset  Has at least one of the high risk factor(s) for progression to severe COVID-19 and/or hospitalization as defined in EUA.  Specific high risk criteria : BMI > 25 and Cardiovascular disease or hypertension   I have spoken and communicated the following to the patient or parent/caregiver regarding COVID monoclonal antibody treatment:  1. FDA has authorized the emergency use for the treatment of mild to moderate COVID-19 in adults and pediatric patients with positive results of direct SARS-CoV-2 viral testing who are 56 years of age and older weighing at least 40 kg, and who are at high risk for progressing to severe COVID-19 and/or hospitalization.  2. The significant known and potential risks and benefits of COVID monoclonal antibody, and the extent to which such potential risks and benefits are unknown.  3. Information on available alternative treatments and the risks and benefits of those alternatives, including clinical trials.  4. Patients treated with COVID monoclonal antibody should continue to self-isolate and use infection control measures (e.g., wear mask, isolate, social distance, avoid sharing personal items, clean and disinfect "high touch" surfaces, and frequent handwashing) according to CDC guidelines.   5. The patient or parent/caregiver has the option to accept or refuse COVID monoclonal antibody treatment.  After reviewing this information with the patient, the  patient has agreed to receive one of the available covid 19 monoclonal antibodies and will be provided an appropriate fact sheet prior to infusion. [name], NP 05/23/2020 8:32 AM   '

## 2020-05-26 DIAGNOSIS — Z981 Arthrodesis status: Secondary | ICD-10-CM | POA: Diagnosis not present

## 2020-05-26 DIAGNOSIS — M47818 Spondylosis without myelopathy or radiculopathy, sacral and sacrococcygeal region: Secondary | ICD-10-CM | POA: Diagnosis not present

## 2020-05-26 DIAGNOSIS — M47816 Spondylosis without myelopathy or radiculopathy, lumbar region: Secondary | ICD-10-CM | POA: Diagnosis not present

## 2020-05-26 DIAGNOSIS — M5137 Other intervertebral disc degeneration, lumbosacral region: Secondary | ICD-10-CM | POA: Diagnosis not present

## 2020-05-26 DIAGNOSIS — M47817 Spondylosis without myelopathy or radiculopathy, lumbosacral region: Secondary | ICD-10-CM | POA: Diagnosis not present

## 2020-05-26 DIAGNOSIS — M5416 Radiculopathy, lumbar region: Secondary | ICD-10-CM | POA: Diagnosis not present

## 2020-05-26 DIAGNOSIS — M5127 Other intervertebral disc displacement, lumbosacral region: Secondary | ICD-10-CM | POA: Diagnosis not present

## 2020-05-26 DIAGNOSIS — M533 Sacrococcygeal disorders, not elsewhere classified: Secondary | ICD-10-CM | POA: Diagnosis not present

## 2020-05-26 DIAGNOSIS — Z9989 Dependence on other enabling machines and devices: Secondary | ICD-10-CM | POA: Diagnosis not present

## 2020-05-26 DIAGNOSIS — G4733 Obstructive sleep apnea (adult) (pediatric): Secondary | ICD-10-CM | POA: Diagnosis not present

## 2020-05-28 DIAGNOSIS — Z20822 Contact with and (suspected) exposure to covid-19: Secondary | ICD-10-CM | POA: Diagnosis not present

## 2020-06-01 DIAGNOSIS — E119 Type 2 diabetes mellitus without complications: Secondary | ICD-10-CM | POA: Diagnosis not present

## 2020-06-01 DIAGNOSIS — U071 COVID-19: Secondary | ICD-10-CM | POA: Diagnosis not present

## 2020-06-16 DIAGNOSIS — R69 Illness, unspecified: Secondary | ICD-10-CM | POA: Diagnosis not present

## 2020-06-16 DIAGNOSIS — Z79899 Other long term (current) drug therapy: Secondary | ICD-10-CM | POA: Diagnosis not present

## 2020-06-26 DIAGNOSIS — Z9989 Dependence on other enabling machines and devices: Secondary | ICD-10-CM | POA: Diagnosis not present

## 2020-06-26 DIAGNOSIS — G4733 Obstructive sleep apnea (adult) (pediatric): Secondary | ICD-10-CM | POA: Diagnosis not present

## 2020-07-26 DIAGNOSIS — Z9989 Dependence on other enabling machines and devices: Secondary | ICD-10-CM | POA: Diagnosis not present

## 2020-07-26 DIAGNOSIS — G4733 Obstructive sleep apnea (adult) (pediatric): Secondary | ICD-10-CM | POA: Diagnosis not present

## 2020-07-27 DIAGNOSIS — R110 Nausea: Secondary | ICD-10-CM | POA: Diagnosis not present

## 2020-07-27 DIAGNOSIS — R197 Diarrhea, unspecified: Secondary | ICD-10-CM | POA: Diagnosis not present

## 2020-07-28 DIAGNOSIS — Z9989 Dependence on other enabling machines and devices: Secondary | ICD-10-CM | POA: Diagnosis not present

## 2020-07-28 DIAGNOSIS — G4733 Obstructive sleep apnea (adult) (pediatric): Secondary | ICD-10-CM | POA: Diagnosis not present

## 2020-08-10 DIAGNOSIS — Z1231 Encounter for screening mammogram for malignant neoplasm of breast: Secondary | ICD-10-CM | POA: Diagnosis not present

## 2020-08-10 DIAGNOSIS — E782 Mixed hyperlipidemia: Secondary | ICD-10-CM | POA: Diagnosis not present

## 2020-08-10 DIAGNOSIS — L989 Disorder of the skin and subcutaneous tissue, unspecified: Secondary | ICD-10-CM | POA: Diagnosis not present

## 2020-08-10 DIAGNOSIS — E119 Type 2 diabetes mellitus without complications: Secondary | ICD-10-CM | POA: Diagnosis not present

## 2020-08-10 DIAGNOSIS — Z840 Family history of diseases of the skin and subcutaneous tissue: Secondary | ICD-10-CM | POA: Diagnosis not present

## 2020-08-10 DIAGNOSIS — I10 Essential (primary) hypertension: Secondary | ICD-10-CM | POA: Diagnosis not present

## 2020-08-11 DIAGNOSIS — Z23 Encounter for immunization: Secondary | ICD-10-CM | POA: Diagnosis not present

## 2020-08-23 DIAGNOSIS — Z1231 Encounter for screening mammogram for malignant neoplasm of breast: Secondary | ICD-10-CM | POA: Diagnosis not present

## 2020-08-26 DIAGNOSIS — Z9989 Dependence on other enabling machines and devices: Secondary | ICD-10-CM | POA: Diagnosis not present

## 2020-08-26 DIAGNOSIS — G4733 Obstructive sleep apnea (adult) (pediatric): Secondary | ICD-10-CM | POA: Diagnosis not present

## 2020-09-19 DIAGNOSIS — R69 Illness, unspecified: Secondary | ICD-10-CM | POA: Diagnosis not present

## 2020-09-26 DIAGNOSIS — G4733 Obstructive sleep apnea (adult) (pediatric): Secondary | ICD-10-CM | POA: Diagnosis not present

## 2020-09-26 DIAGNOSIS — Z9989 Dependence on other enabling machines and devices: Secondary | ICD-10-CM | POA: Diagnosis not present

## 2020-10-24 DIAGNOSIS — Z9989 Dependence on other enabling machines and devices: Secondary | ICD-10-CM | POA: Diagnosis not present

## 2020-10-24 DIAGNOSIS — G4733 Obstructive sleep apnea (adult) (pediatric): Secondary | ICD-10-CM | POA: Diagnosis not present

## 2020-11-02 DIAGNOSIS — Z0001 Encounter for general adult medical examination with abnormal findings: Secondary | ICD-10-CM | POA: Diagnosis not present

## 2020-11-02 DIAGNOSIS — I129 Hypertensive chronic kidney disease with stage 1 through stage 4 chronic kidney disease, or unspecified chronic kidney disease: Secondary | ICD-10-CM | POA: Diagnosis not present

## 2020-11-02 DIAGNOSIS — Z79899 Other long term (current) drug therapy: Secondary | ICD-10-CM | POA: Diagnosis not present

## 2020-11-02 DIAGNOSIS — E1122 Type 2 diabetes mellitus with diabetic chronic kidney disease: Secondary | ICD-10-CM | POA: Diagnosis not present

## 2020-11-02 DIAGNOSIS — Z7984 Long term (current) use of oral hypoglycemic drugs: Secondary | ICD-10-CM | POA: Diagnosis not present

## 2020-11-02 DIAGNOSIS — E538 Deficiency of other specified B group vitamins: Secondary | ICD-10-CM | POA: Diagnosis not present

## 2020-11-02 DIAGNOSIS — E782 Mixed hyperlipidemia: Secondary | ICD-10-CM | POA: Diagnosis not present

## 2020-11-02 DIAGNOSIS — D631 Anemia in chronic kidney disease: Secondary | ICD-10-CM | POA: Diagnosis not present

## 2020-11-02 DIAGNOSIS — N1831 Chronic kidney disease, stage 3a: Secondary | ICD-10-CM | POA: Diagnosis not present

## 2020-11-06 DIAGNOSIS — Z8711 Personal history of peptic ulcer disease: Secondary | ICD-10-CM | POA: Diagnosis not present

## 2020-11-06 DIAGNOSIS — E1122 Type 2 diabetes mellitus with diabetic chronic kidney disease: Secondary | ICD-10-CM | POA: Diagnosis not present

## 2020-11-06 DIAGNOSIS — R69 Illness, unspecified: Secondary | ICD-10-CM | POA: Diagnosis not present

## 2020-11-06 DIAGNOSIS — I129 Hypertensive chronic kidney disease with stage 1 through stage 4 chronic kidney disease, or unspecified chronic kidney disease: Secondary | ICD-10-CM | POA: Diagnosis not present

## 2020-11-06 DIAGNOSIS — E782 Mixed hyperlipidemia: Secondary | ICD-10-CM | POA: Diagnosis not present

## 2020-11-06 DIAGNOSIS — Z0000 Encounter for general adult medical examination without abnormal findings: Secondary | ICD-10-CM | POA: Diagnosis not present

## 2020-11-06 DIAGNOSIS — N1831 Chronic kidney disease, stage 3a: Secondary | ICD-10-CM | POA: Diagnosis not present

## 2020-11-06 DIAGNOSIS — K219 Gastro-esophageal reflux disease without esophagitis: Secondary | ICD-10-CM | POA: Diagnosis not present

## 2020-11-06 DIAGNOSIS — Z6841 Body Mass Index (BMI) 40.0 and over, adult: Secondary | ICD-10-CM | POA: Diagnosis not present

## 2020-11-06 DIAGNOSIS — G4733 Obstructive sleep apnea (adult) (pediatric): Secondary | ICD-10-CM | POA: Diagnosis not present

## 2020-11-06 DIAGNOSIS — Z23 Encounter for immunization: Secondary | ICD-10-CM | POA: Diagnosis not present

## 2020-11-24 DIAGNOSIS — Z9989 Dependence on other enabling machines and devices: Secondary | ICD-10-CM | POA: Diagnosis not present

## 2020-11-24 DIAGNOSIS — G4733 Obstructive sleep apnea (adult) (pediatric): Secondary | ICD-10-CM | POA: Diagnosis not present

## 2020-12-24 DIAGNOSIS — Z9989 Dependence on other enabling machines and devices: Secondary | ICD-10-CM | POA: Diagnosis not present

## 2020-12-24 DIAGNOSIS — G4733 Obstructive sleep apnea (adult) (pediatric): Secondary | ICD-10-CM | POA: Diagnosis not present

## 2021-01-03 DIAGNOSIS — Z79899 Other long term (current) drug therapy: Secondary | ICD-10-CM | POA: Diagnosis not present

## 2021-01-03 DIAGNOSIS — R69 Illness, unspecified: Secondary | ICD-10-CM | POA: Diagnosis not present

## 2021-01-22 DIAGNOSIS — J069 Acute upper respiratory infection, unspecified: Secondary | ICD-10-CM | POA: Diagnosis not present

## 2021-01-24 DIAGNOSIS — Z9989 Dependence on other enabling machines and devices: Secondary | ICD-10-CM | POA: Diagnosis not present

## 2021-01-24 DIAGNOSIS — G4733 Obstructive sleep apnea (adult) (pediatric): Secondary | ICD-10-CM | POA: Diagnosis not present

## 2021-02-08 DIAGNOSIS — M5417 Radiculopathy, lumbosacral region: Secondary | ICD-10-CM | POA: Diagnosis not present

## 2021-02-08 DIAGNOSIS — D649 Anemia, unspecified: Secondary | ICD-10-CM | POA: Diagnosis not present

## 2021-02-08 DIAGNOSIS — E538 Deficiency of other specified B group vitamins: Secondary | ICD-10-CM | POA: Diagnosis not present

## 2021-03-06 DIAGNOSIS — E119 Type 2 diabetes mellitus without complications: Secondary | ICD-10-CM | POA: Diagnosis not present

## 2021-03-06 DIAGNOSIS — H2513 Age-related nuclear cataract, bilateral: Secondary | ICD-10-CM | POA: Diagnosis not present

## 2021-03-06 DIAGNOSIS — H40053 Ocular hypertension, bilateral: Secondary | ICD-10-CM | POA: Diagnosis not present

## 2021-03-20 DIAGNOSIS — G4733 Obstructive sleep apnea (adult) (pediatric): Secondary | ICD-10-CM | POA: Diagnosis not present

## 2021-03-20 DIAGNOSIS — Z9989 Dependence on other enabling machines and devices: Secondary | ICD-10-CM | POA: Diagnosis not present

## 2021-03-28 DIAGNOSIS — Z79899 Other long term (current) drug therapy: Secondary | ICD-10-CM | POA: Diagnosis not present

## 2021-03-28 DIAGNOSIS — R69 Illness, unspecified: Secondary | ICD-10-CM | POA: Diagnosis not present

## 2021-04-20 DIAGNOSIS — Z9989 Dependence on other enabling machines and devices: Secondary | ICD-10-CM | POA: Diagnosis not present

## 2021-04-20 DIAGNOSIS — G4733 Obstructive sleep apnea (adult) (pediatric): Secondary | ICD-10-CM | POA: Diagnosis not present

## 2021-05-20 DIAGNOSIS — G4733 Obstructive sleep apnea (adult) (pediatric): Secondary | ICD-10-CM | POA: Diagnosis not present

## 2021-05-21 DIAGNOSIS — Z23 Encounter for immunization: Secondary | ICD-10-CM | POA: Diagnosis not present

## 2021-05-21 DIAGNOSIS — M5417 Radiculopathy, lumbosacral region: Secondary | ICD-10-CM | POA: Diagnosis not present

## 2021-05-21 DIAGNOSIS — E119 Type 2 diabetes mellitus without complications: Secondary | ICD-10-CM | POA: Diagnosis not present

## 2021-05-21 DIAGNOSIS — I10 Essential (primary) hypertension: Secondary | ICD-10-CM | POA: Diagnosis not present

## 2021-05-21 DIAGNOSIS — E782 Mixed hyperlipidemia: Secondary | ICD-10-CM | POA: Diagnosis not present

## 2021-05-21 DIAGNOSIS — R5383 Other fatigue: Secondary | ICD-10-CM | POA: Diagnosis not present

## 2021-05-21 DIAGNOSIS — D649 Anemia, unspecified: Secondary | ICD-10-CM | POA: Diagnosis not present

## 2021-05-21 DIAGNOSIS — E538 Deficiency of other specified B group vitamins: Secondary | ICD-10-CM | POA: Diagnosis not present

## 2021-05-25 IMAGING — CT CT BIOPSY
1 of 3 series · 13 of 32 positions shown, 19 images · non-contrast
Comparison: none

CLINICAL DATA: Right-sided pelvic pain. SI joint anesthetic
injection requested.

[Series 2: needle -guided injection · axial · 0.86mm/px · z∈[-120,-66]mm · 13 of 33 slices shown, 19 images]
[im 3/33  soft-tissue]
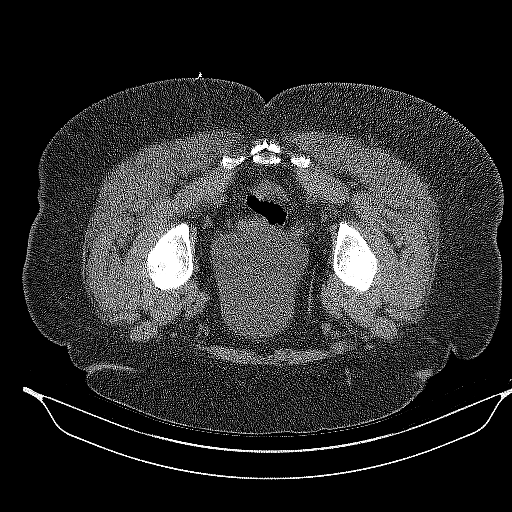
[im 3/33  bone]
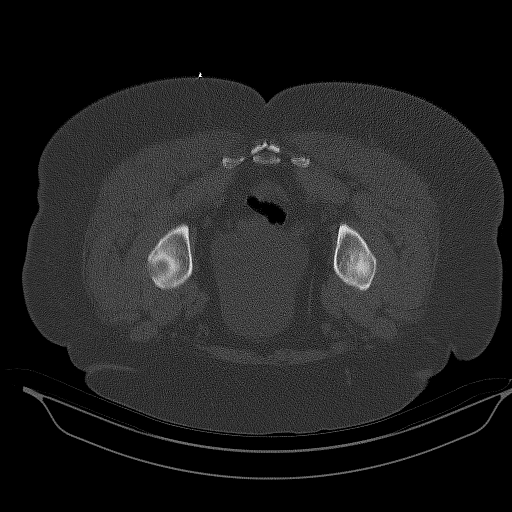
[im 5/33  soft-tissue]
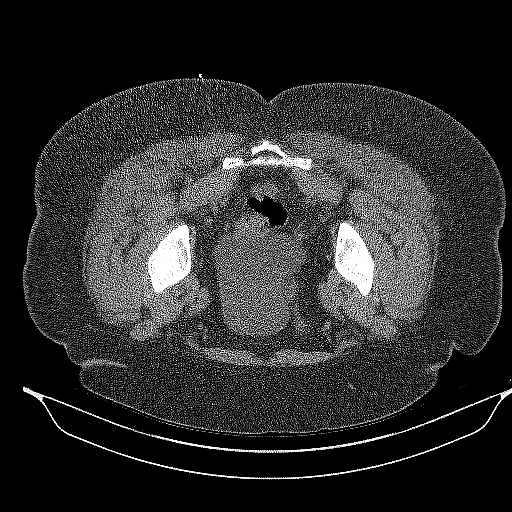
[im 7/33  soft-tissue]
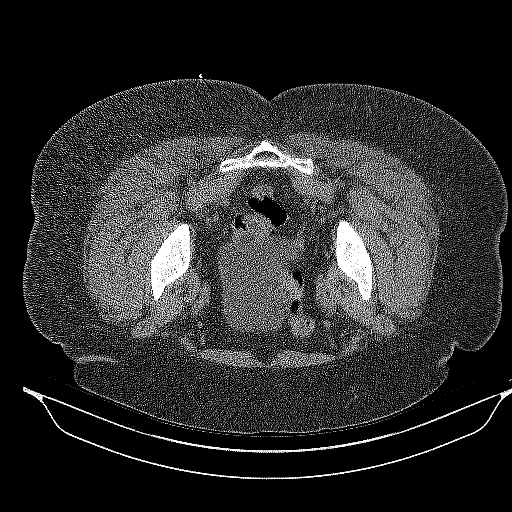
[im 9/33  soft-tissue]
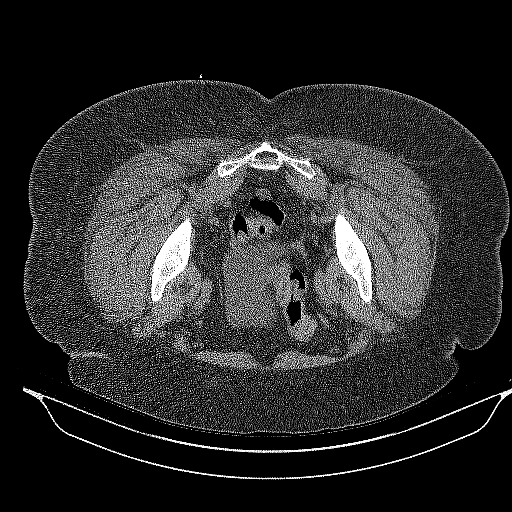
[im 11/33  soft-tissue]
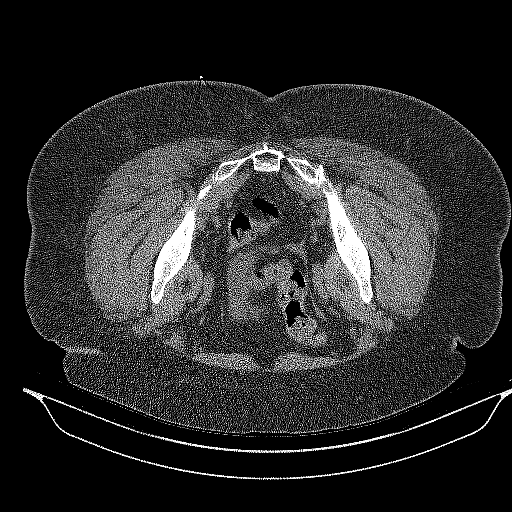
[im 13/33  soft-tissue]
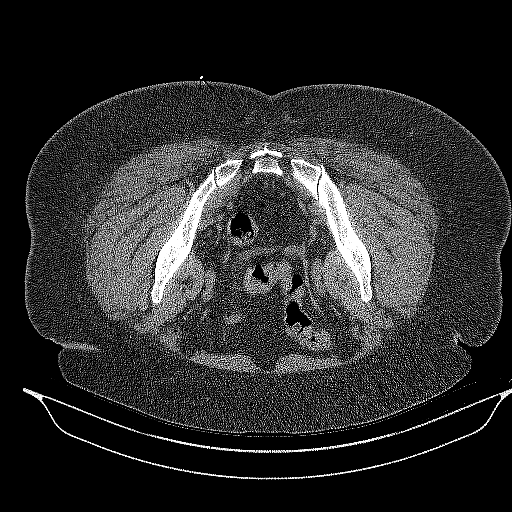
[im 18/33  soft-tissue]
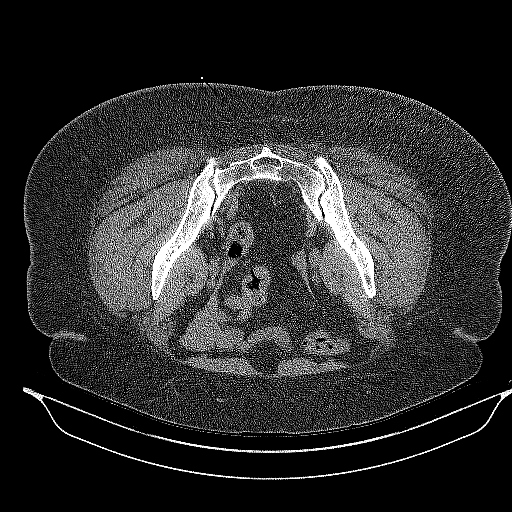
[im 20/33  soft-tissue]
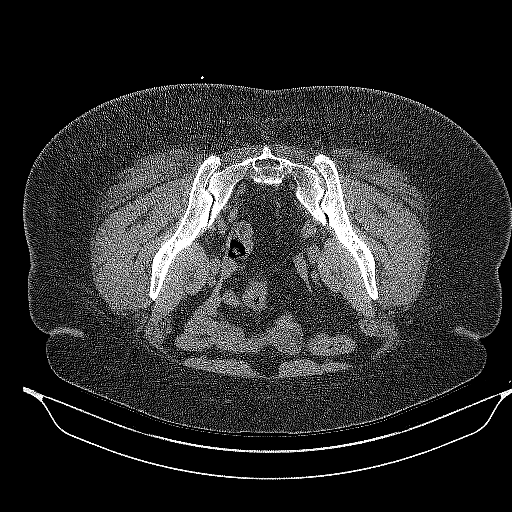
[im 22/33  soft-tissue]
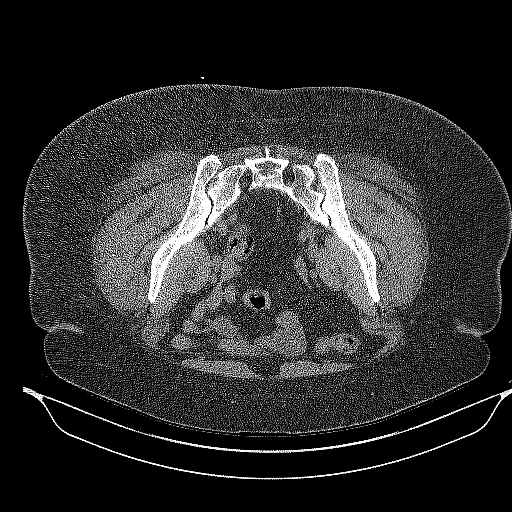
[im 22/33  bone]
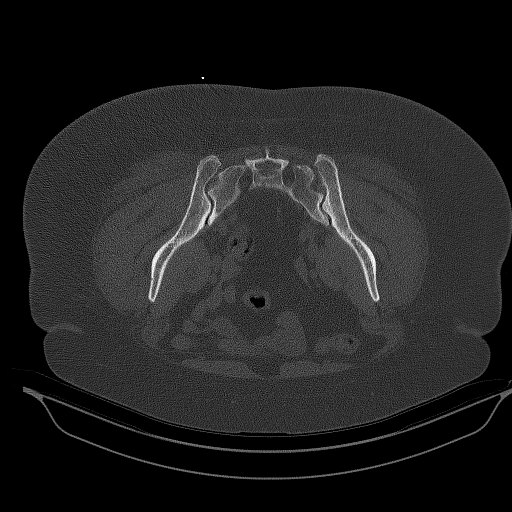
[im 24/33  soft-tissue]
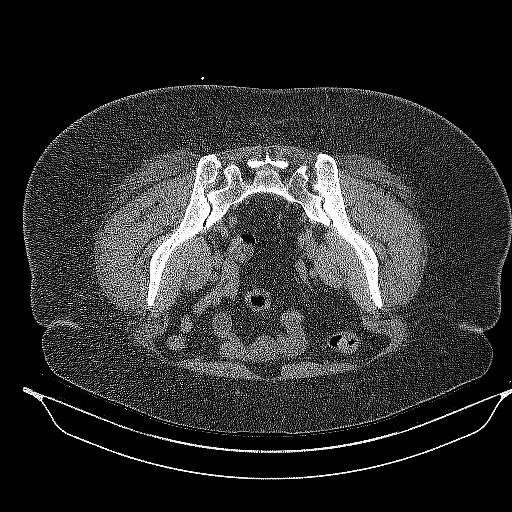
[im 24/33  lung]
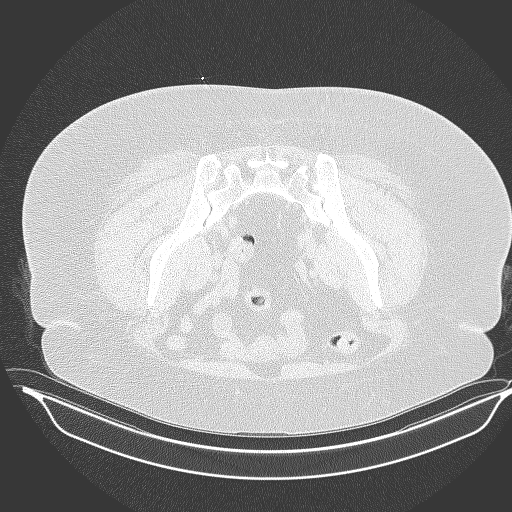
[im 26/33  soft-tissue]
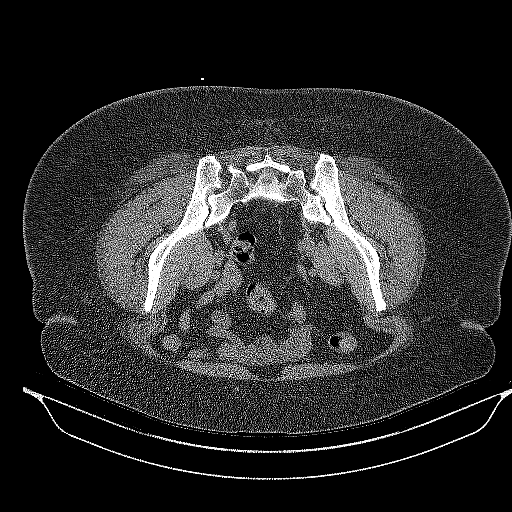
[im 26/33  lung]
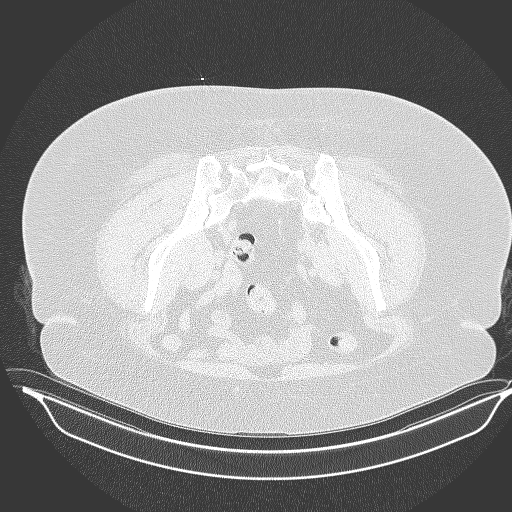
[im 28/33  soft-tissue]
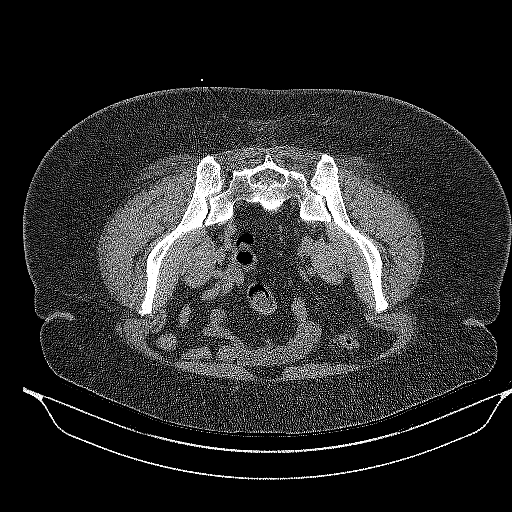
[im 28/33  lung]
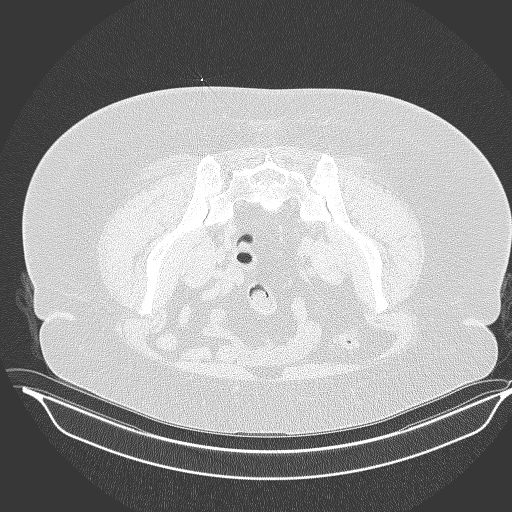
[im 30/33  soft-tissue]
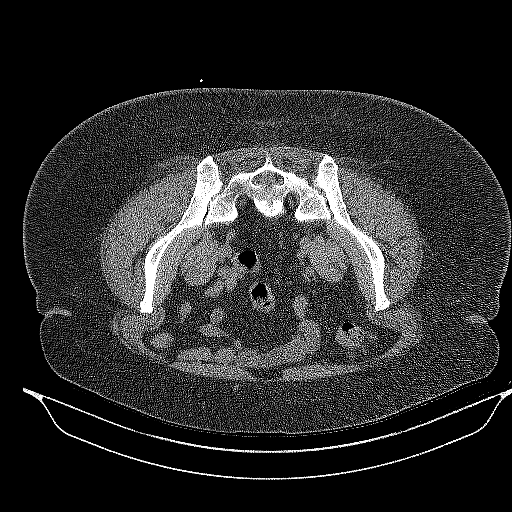
[im 30/33  lung]
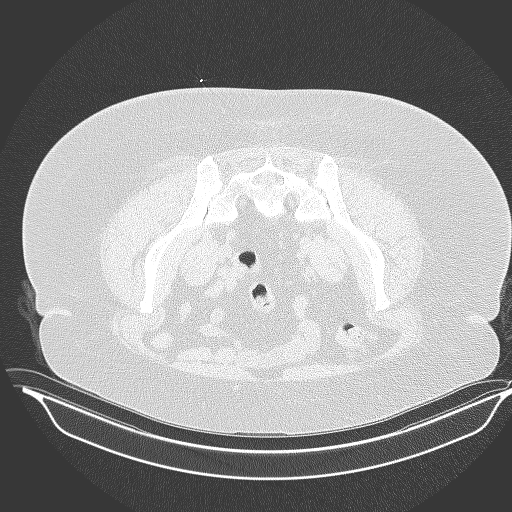

[13 of 32 positions shown; findings below may reference images not displayed]

EXAM:
Right CT GUIDED SI JOINT INJECTION



After local anesthesia with 1% lidocaine without epinephrine and
subsequent deep anesthesia, a 3.5 inch 22 gauge spinal needle was
advanced into the right SI joint under intermittent CT guidance.

Once the needle was in satisfactory position, representative image
was captured with the needle demonstrated in the sacroiliac joint.
Subsequently, 2.5 cc 0.5% bupivacaine was injected into the right SI
joint. Needles removed and a sterile dressing applied.

No complications were observed.
IMPRESSION: Successful CT-guided right SI joint injection.

The patient felt only mild discomfort during the injection. She was
instructed to keep a pain log for her follow-up visit.

## 2021-06-28 DIAGNOSIS — R69 Illness, unspecified: Secondary | ICD-10-CM | POA: Diagnosis not present

## 2021-06-28 DIAGNOSIS — Z79899 Other long term (current) drug therapy: Secondary | ICD-10-CM | POA: Diagnosis not present

## 2021-08-01 DIAGNOSIS — Z9989 Dependence on other enabling machines and devices: Secondary | ICD-10-CM | POA: Diagnosis not present

## 2021-08-01 DIAGNOSIS — I10 Essential (primary) hypertension: Secondary | ICD-10-CM | POA: Diagnosis not present

## 2021-08-01 DIAGNOSIS — G4733 Obstructive sleep apnea (adult) (pediatric): Secondary | ICD-10-CM | POA: Diagnosis not present

## 2021-08-06 DIAGNOSIS — Z9989 Dependence on other enabling machines and devices: Secondary | ICD-10-CM | POA: Diagnosis not present

## 2021-08-06 DIAGNOSIS — G4733 Obstructive sleep apnea (adult) (pediatric): Secondary | ICD-10-CM | POA: Diagnosis not present

## 2021-09-04 DIAGNOSIS — Z1211 Encounter for screening for malignant neoplasm of colon: Secondary | ICD-10-CM | POA: Diagnosis not present

## 2021-09-06 DIAGNOSIS — G4733 Obstructive sleep apnea (adult) (pediatric): Secondary | ICD-10-CM | POA: Diagnosis not present

## 2021-09-07 DIAGNOSIS — M5136 Other intervertebral disc degeneration, lumbar region: Secondary | ICD-10-CM | POA: Diagnosis not present

## 2021-09-07 DIAGNOSIS — M5441 Lumbago with sciatica, right side: Secondary | ICD-10-CM | POA: Diagnosis not present

## 2021-09-07 DIAGNOSIS — E538 Deficiency of other specified B group vitamins: Secondary | ICD-10-CM | POA: Diagnosis not present

## 2021-09-07 DIAGNOSIS — M4328 Fusion of spine, sacral and sacrococcygeal region: Secondary | ICD-10-CM | POA: Diagnosis not present

## 2021-09-07 DIAGNOSIS — M545 Low back pain, unspecified: Secondary | ICD-10-CM | POA: Diagnosis not present

## 2021-09-07 DIAGNOSIS — G8929 Other chronic pain: Secondary | ICD-10-CM | POA: Diagnosis not present

## 2021-09-07 DIAGNOSIS — D509 Iron deficiency anemia, unspecified: Secondary | ICD-10-CM | POA: Diagnosis not present

## 2021-09-07 DIAGNOSIS — E559 Vitamin D deficiency, unspecified: Secondary | ICD-10-CM | POA: Diagnosis not present

## 2021-09-07 DIAGNOSIS — E119 Type 2 diabetes mellitus without complications: Secondary | ICD-10-CM | POA: Diagnosis not present

## 2021-09-07 DIAGNOSIS — M5417 Radiculopathy, lumbosacral region: Secondary | ICD-10-CM | POA: Diagnosis not present

## 2021-09-07 DIAGNOSIS — R39198 Other difficulties with micturition: Secondary | ICD-10-CM | POA: Diagnosis not present

## 2021-09-14 DIAGNOSIS — E119 Type 2 diabetes mellitus without complications: Secondary | ICD-10-CM | POA: Diagnosis not present

## 2021-09-14 DIAGNOSIS — E559 Vitamin D deficiency, unspecified: Secondary | ICD-10-CM | POA: Diagnosis not present

## 2021-09-14 DIAGNOSIS — D509 Iron deficiency anemia, unspecified: Secondary | ICD-10-CM | POA: Diagnosis not present

## 2021-09-14 DIAGNOSIS — D649 Anemia, unspecified: Secondary | ICD-10-CM | POA: Diagnosis not present

## 2021-09-14 DIAGNOSIS — E538 Deficiency of other specified B group vitamins: Secondary | ICD-10-CM | POA: Diagnosis not present

## 2021-09-14 DIAGNOSIS — Z6841 Body Mass Index (BMI) 40.0 and over, adult: Secondary | ICD-10-CM | POA: Diagnosis not present

## 2021-09-15 DIAGNOSIS — M5441 Lumbago with sciatica, right side: Secondary | ICD-10-CM | POA: Diagnosis not present

## 2021-09-15 DIAGNOSIS — R39198 Other difficulties with micturition: Secondary | ICD-10-CM | POA: Diagnosis not present

## 2021-09-15 DIAGNOSIS — M545 Low back pain, unspecified: Secondary | ICD-10-CM | POA: Diagnosis not present

## 2021-09-15 DIAGNOSIS — M5136 Other intervertebral disc degeneration, lumbar region: Secondary | ICD-10-CM | POA: Diagnosis not present

## 2021-09-15 DIAGNOSIS — G8929 Other chronic pain: Secondary | ICD-10-CM | POA: Diagnosis not present

## 2021-09-28 DIAGNOSIS — R69 Illness, unspecified: Secondary | ICD-10-CM | POA: Diagnosis not present

## 2021-09-28 DIAGNOSIS — Z79899 Other long term (current) drug therapy: Secondary | ICD-10-CM | POA: Diagnosis not present

## 2021-10-07 DIAGNOSIS — G4733 Obstructive sleep apnea (adult) (pediatric): Secondary | ICD-10-CM | POA: Diagnosis not present

## 2021-10-30 DIAGNOSIS — M25551 Pain in right hip: Secondary | ICD-10-CM | POA: Diagnosis not present

## 2021-12-10 DIAGNOSIS — Z79899 Other long term (current) drug therapy: Secondary | ICD-10-CM | POA: Diagnosis not present

## 2021-12-10 DIAGNOSIS — E538 Deficiency of other specified B group vitamins: Secondary | ICD-10-CM | POA: Diagnosis not present

## 2021-12-10 DIAGNOSIS — N1831 Chronic kidney disease, stage 3a: Secondary | ICD-10-CM | POA: Diagnosis not present

## 2021-12-10 DIAGNOSIS — Z6841 Body Mass Index (BMI) 40.0 and over, adult: Secondary | ICD-10-CM | POA: Diagnosis not present

## 2021-12-10 DIAGNOSIS — Z0000 Encounter for general adult medical examination without abnormal findings: Secondary | ICD-10-CM | POA: Diagnosis not present

## 2021-12-10 DIAGNOSIS — E1122 Type 2 diabetes mellitus with diabetic chronic kidney disease: Secondary | ICD-10-CM | POA: Diagnosis not present

## 2021-12-10 DIAGNOSIS — Z7982 Long term (current) use of aspirin: Secondary | ICD-10-CM | POA: Diagnosis not present

## 2021-12-10 DIAGNOSIS — E559 Vitamin D deficiency, unspecified: Secondary | ICD-10-CM | POA: Diagnosis not present

## 2021-12-10 DIAGNOSIS — D509 Iron deficiency anemia, unspecified: Secondary | ICD-10-CM | POA: Diagnosis not present

## 2021-12-10 DIAGNOSIS — E782 Mixed hyperlipidemia: Secondary | ICD-10-CM | POA: Diagnosis not present

## 2021-12-10 DIAGNOSIS — Z7984 Long term (current) use of oral hypoglycemic drugs: Secondary | ICD-10-CM | POA: Diagnosis not present

## 2021-12-12 DIAGNOSIS — K219 Gastro-esophageal reflux disease without esophagitis: Secondary | ICD-10-CM | POA: Diagnosis not present

## 2021-12-12 DIAGNOSIS — N1831 Chronic kidney disease, stage 3a: Secondary | ICD-10-CM | POA: Diagnosis not present

## 2021-12-12 DIAGNOSIS — G4733 Obstructive sleep apnea (adult) (pediatric): Secondary | ICD-10-CM | POA: Diagnosis not present

## 2021-12-12 DIAGNOSIS — R69 Illness, unspecified: Secondary | ICD-10-CM | POA: Diagnosis not present

## 2021-12-12 DIAGNOSIS — E119 Type 2 diabetes mellitus without complications: Secondary | ICD-10-CM | POA: Diagnosis not present

## 2021-12-12 DIAGNOSIS — M5417 Radiculopathy, lumbosacral region: Secondary | ICD-10-CM | POA: Diagnosis not present

## 2021-12-12 DIAGNOSIS — I129 Hypertensive chronic kidney disease with stage 1 through stage 4 chronic kidney disease, or unspecified chronic kidney disease: Secondary | ICD-10-CM | POA: Diagnosis not present

## 2021-12-12 DIAGNOSIS — Z9989 Dependence on other enabling machines and devices: Secondary | ICD-10-CM | POA: Diagnosis not present

## 2021-12-12 DIAGNOSIS — M5030 Other cervical disc degeneration, unspecified cervical region: Secondary | ICD-10-CM | POA: Diagnosis not present

## 2021-12-12 DIAGNOSIS — Z0000 Encounter for general adult medical examination without abnormal findings: Secondary | ICD-10-CM | POA: Diagnosis not present

## 2021-12-12 DIAGNOSIS — E782 Mixed hyperlipidemia: Secondary | ICD-10-CM | POA: Diagnosis not present

## 2021-12-12 DIAGNOSIS — Z6841 Body Mass Index (BMI) 40.0 and over, adult: Secondary | ICD-10-CM | POA: Diagnosis not present

## 2021-12-22 DIAGNOSIS — R110 Nausea: Secondary | ICD-10-CM | POA: Diagnosis not present

## 2021-12-22 DIAGNOSIS — R42 Dizziness and giddiness: Secondary | ICD-10-CM | POA: Diagnosis not present

## 2021-12-28 DIAGNOSIS — R69 Illness, unspecified: Secondary | ICD-10-CM | POA: Diagnosis not present

## 2021-12-28 DIAGNOSIS — Z79899 Other long term (current) drug therapy: Secondary | ICD-10-CM | POA: Diagnosis not present

## 2022-01-01 DIAGNOSIS — N1831 Chronic kidney disease, stage 3a: Secondary | ICD-10-CM | POA: Diagnosis not present

## 2022-01-01 DIAGNOSIS — M25521 Pain in right elbow: Secondary | ICD-10-CM | POA: Diagnosis not present

## 2022-01-01 DIAGNOSIS — Z6841 Body Mass Index (BMI) 40.0 and over, adult: Secondary | ICD-10-CM | POA: Diagnosis not present

## 2022-01-01 DIAGNOSIS — M7711 Lateral epicondylitis, right elbow: Secondary | ICD-10-CM | POA: Diagnosis not present

## 2022-01-01 DIAGNOSIS — E1122 Type 2 diabetes mellitus with diabetic chronic kidney disease: Secondary | ICD-10-CM | POA: Diagnosis not present

## 2022-01-03 DIAGNOSIS — Z1231 Encounter for screening mammogram for malignant neoplasm of breast: Secondary | ICD-10-CM | POA: Diagnosis not present

## 2022-03-09 DIAGNOSIS — E782 Mixed hyperlipidemia: Secondary | ICD-10-CM | POA: Diagnosis not present

## 2022-03-09 DIAGNOSIS — I10 Essential (primary) hypertension: Secondary | ICD-10-CM | POA: Diagnosis not present

## 2022-03-09 DIAGNOSIS — N1831 Chronic kidney disease, stage 3a: Secondary | ICD-10-CM | POA: Diagnosis not present

## 2022-03-09 DIAGNOSIS — D649 Anemia, unspecified: Secondary | ICD-10-CM | POA: Diagnosis not present

## 2022-03-09 DIAGNOSIS — E119 Type 2 diabetes mellitus without complications: Secondary | ICD-10-CM | POA: Diagnosis not present

## 2022-03-14 DIAGNOSIS — E782 Mixed hyperlipidemia: Secondary | ICD-10-CM | POA: Diagnosis not present

## 2022-03-14 DIAGNOSIS — E119 Type 2 diabetes mellitus without complications: Secondary | ICD-10-CM | POA: Diagnosis not present

## 2022-03-14 DIAGNOSIS — I10 Essential (primary) hypertension: Secondary | ICD-10-CM | POA: Diagnosis not present

## 2022-03-14 DIAGNOSIS — D649 Anemia, unspecified: Secondary | ICD-10-CM | POA: Diagnosis not present

## 2022-03-22 DIAGNOSIS — R69 Illness, unspecified: Secondary | ICD-10-CM | POA: Diagnosis not present

## 2022-03-22 DIAGNOSIS — Z79899 Other long term (current) drug therapy: Secondary | ICD-10-CM | POA: Diagnosis not present

## 2022-04-11 DIAGNOSIS — G4733 Obstructive sleep apnea (adult) (pediatric): Secondary | ICD-10-CM | POA: Diagnosis not present

## 2022-05-11 DIAGNOSIS — G4733 Obstructive sleep apnea (adult) (pediatric): Secondary | ICD-10-CM | POA: Diagnosis not present

## 2022-05-21 DIAGNOSIS — E119 Type 2 diabetes mellitus without complications: Secondary | ICD-10-CM | POA: Diagnosis not present

## 2022-05-21 DIAGNOSIS — H40053 Ocular hypertension, bilateral: Secondary | ICD-10-CM | POA: Diagnosis not present

## 2022-05-21 DIAGNOSIS — H2513 Age-related nuclear cataract, bilateral: Secondary | ICD-10-CM | POA: Diagnosis not present

## 2022-05-24 DIAGNOSIS — L03032 Cellulitis of left toe: Secondary | ICD-10-CM | POA: Diagnosis not present

## 2022-06-18 DIAGNOSIS — R69 Illness, unspecified: Secondary | ICD-10-CM | POA: Diagnosis not present

## 2022-06-18 DIAGNOSIS — Z79899 Other long term (current) drug therapy: Secondary | ICD-10-CM | POA: Diagnosis not present

## 2022-07-13 DIAGNOSIS — E782 Mixed hyperlipidemia: Secondary | ICD-10-CM | POA: Diagnosis not present

## 2022-07-13 DIAGNOSIS — E119 Type 2 diabetes mellitus without complications: Secondary | ICD-10-CM | POA: Diagnosis not present

## 2022-07-13 DIAGNOSIS — I10 Essential (primary) hypertension: Secondary | ICD-10-CM | POA: Diagnosis not present

## 2022-07-15 DIAGNOSIS — Z7984 Long term (current) use of oral hypoglycemic drugs: Secondary | ICD-10-CM | POA: Diagnosis not present

## 2022-07-15 DIAGNOSIS — I952 Hypotension due to drugs: Secondary | ICD-10-CM | POA: Diagnosis not present

## 2022-07-15 DIAGNOSIS — E1122 Type 2 diabetes mellitus with diabetic chronic kidney disease: Secondary | ICD-10-CM | POA: Diagnosis not present

## 2022-07-15 DIAGNOSIS — Z6841 Body Mass Index (BMI) 40.0 and over, adult: Secondary | ICD-10-CM | POA: Diagnosis not present

## 2022-07-15 DIAGNOSIS — I129 Hypertensive chronic kidney disease with stage 1 through stage 4 chronic kidney disease, or unspecified chronic kidney disease: Secondary | ICD-10-CM | POA: Diagnosis not present

## 2022-07-15 DIAGNOSIS — E782 Mixed hyperlipidemia: Secondary | ICD-10-CM | POA: Diagnosis not present

## 2022-07-15 DIAGNOSIS — N1831 Chronic kidney disease, stage 3a: Secondary | ICD-10-CM | POA: Diagnosis not present

## 2022-07-15 DIAGNOSIS — Z7985 Long-term (current) use of injectable non-insulin antidiabetic drugs: Secondary | ICD-10-CM | POA: Diagnosis not present

## 2022-07-16 DIAGNOSIS — G4733 Obstructive sleep apnea (adult) (pediatric): Secondary | ICD-10-CM | POA: Diagnosis not present

## 2022-07-31 DIAGNOSIS — Z79899 Other long term (current) drug therapy: Secondary | ICD-10-CM | POA: Diagnosis not present

## 2022-07-31 DIAGNOSIS — R69 Illness, unspecified: Secondary | ICD-10-CM | POA: Diagnosis not present

## 2022-08-16 DIAGNOSIS — G4733 Obstructive sleep apnea (adult) (pediatric): Secondary | ICD-10-CM | POA: Diagnosis not present

## 2022-09-16 DIAGNOSIS — G4733 Obstructive sleep apnea (adult) (pediatric): Secondary | ICD-10-CM | POA: Diagnosis not present

## 2022-09-20 DIAGNOSIS — D649 Anemia, unspecified: Secondary | ICD-10-CM | POA: Diagnosis not present

## 2022-09-20 DIAGNOSIS — D509 Iron deficiency anemia, unspecified: Secondary | ICD-10-CM | POA: Diagnosis not present

## 2022-10-05 DIAGNOSIS — Z886 Allergy status to analgesic agent status: Secondary | ICD-10-CM | POA: Diagnosis not present

## 2022-10-05 DIAGNOSIS — K6389 Other specified diseases of intestine: Secondary | ICD-10-CM | POA: Diagnosis not present

## 2022-10-05 DIAGNOSIS — R109 Unspecified abdominal pain: Secondary | ICD-10-CM | POA: Diagnosis not present

## 2022-10-05 DIAGNOSIS — Z9049 Acquired absence of other specified parts of digestive tract: Secondary | ICD-10-CM | POA: Diagnosis not present

## 2022-10-05 DIAGNOSIS — R1084 Generalized abdominal pain: Secondary | ICD-10-CM | POA: Diagnosis not present

## 2022-10-05 DIAGNOSIS — R197 Diarrhea, unspecified: Secondary | ICD-10-CM | POA: Diagnosis not present

## 2022-10-05 DIAGNOSIS — Z79899 Other long term (current) drug therapy: Secondary | ICD-10-CM | POA: Diagnosis not present

## 2022-10-05 DIAGNOSIS — R112 Nausea with vomiting, unspecified: Secondary | ICD-10-CM | POA: Diagnosis not present

## 2022-10-05 DIAGNOSIS — Z880 Allergy status to penicillin: Secondary | ICD-10-CM | POA: Diagnosis not present

## 2022-10-05 DIAGNOSIS — Z881 Allergy status to other antibiotic agents status: Secondary | ICD-10-CM | POA: Diagnosis not present

## 2022-10-05 DIAGNOSIS — Z883 Allergy status to other anti-infective agents status: Secondary | ICD-10-CM | POA: Diagnosis not present

## 2022-10-05 DIAGNOSIS — Z885 Allergy status to narcotic agent status: Secondary | ICD-10-CM | POA: Diagnosis not present

## 2022-10-05 DIAGNOSIS — Z7984 Long term (current) use of oral hypoglycemic drugs: Secondary | ICD-10-CM | POA: Diagnosis not present

## 2022-10-05 DIAGNOSIS — R140 Abdominal distension (gaseous): Secondary | ICD-10-CM | POA: Diagnosis not present

## 2022-10-05 DIAGNOSIS — Z90710 Acquired absence of both cervix and uterus: Secondary | ICD-10-CM | POA: Diagnosis not present

## 2022-10-05 DIAGNOSIS — Z7982 Long term (current) use of aspirin: Secondary | ICD-10-CM | POA: Diagnosis not present

## 2022-10-05 DIAGNOSIS — R509 Fever, unspecified: Secondary | ICD-10-CM | POA: Diagnosis not present

## 2022-10-05 DIAGNOSIS — K760 Fatty (change of) liver, not elsewhere classified: Secondary | ICD-10-CM | POA: Diagnosis not present

## 2022-10-05 DIAGNOSIS — Z888 Allergy status to other drugs, medicaments and biological substances status: Secondary | ICD-10-CM | POA: Diagnosis not present

## 2022-10-05 DIAGNOSIS — K529 Noninfective gastroenteritis and colitis, unspecified: Secondary | ICD-10-CM | POA: Diagnosis not present

## 2022-10-08 DIAGNOSIS — K529 Noninfective gastroenteritis and colitis, unspecified: Secondary | ICD-10-CM | POA: Diagnosis not present

## 2022-10-08 DIAGNOSIS — R110 Nausea: Secondary | ICD-10-CM | POA: Diagnosis not present

## 2022-10-23 DIAGNOSIS — F3131 Bipolar disorder, current episode depressed, mild: Secondary | ICD-10-CM | POA: Diagnosis not present

## 2022-10-24 DIAGNOSIS — Z6841 Body Mass Index (BMI) 40.0 and over, adult: Secondary | ICD-10-CM | POA: Diagnosis not present

## 2022-10-24 DIAGNOSIS — K529 Noninfective gastroenteritis and colitis, unspecified: Secondary | ICD-10-CM | POA: Diagnosis not present

## 2022-10-24 DIAGNOSIS — I10 Essential (primary) hypertension: Secondary | ICD-10-CM | POA: Diagnosis not present

## 2022-10-24 DIAGNOSIS — B372 Candidiasis of skin and nail: Secondary | ICD-10-CM | POA: Diagnosis not present

## 2022-11-28 DIAGNOSIS — D649 Anemia, unspecified: Secondary | ICD-10-CM | POA: Diagnosis not present

## 2022-11-28 DIAGNOSIS — R1013 Epigastric pain: Secondary | ICD-10-CM | POA: Diagnosis not present

## 2022-11-28 DIAGNOSIS — R197 Diarrhea, unspecified: Secondary | ICD-10-CM | POA: Diagnosis not present

## 2022-12-03 DIAGNOSIS — Z9989 Dependence on other enabling machines and devices: Secondary | ICD-10-CM | POA: Diagnosis not present

## 2022-12-03 DIAGNOSIS — G4733 Obstructive sleep apnea (adult) (pediatric): Secondary | ICD-10-CM | POA: Diagnosis not present

## 2022-12-27 DIAGNOSIS — R197 Diarrhea, unspecified: Secondary | ICD-10-CM | POA: Diagnosis not present

## 2022-12-27 DIAGNOSIS — R112 Nausea with vomiting, unspecified: Secondary | ICD-10-CM | POA: Diagnosis not present

## 2023-01-02 DIAGNOSIS — G4733 Obstructive sleep apnea (adult) (pediatric): Secondary | ICD-10-CM | POA: Diagnosis not present

## 2023-01-14 DIAGNOSIS — F314 Bipolar disorder, current episode depressed, severe, without psychotic features: Secondary | ICD-10-CM | POA: Diagnosis not present

## 2023-01-28 DIAGNOSIS — R1013 Epigastric pain: Secondary | ICD-10-CM | POA: Diagnosis not present

## 2023-01-28 DIAGNOSIS — K3189 Other diseases of stomach and duodenum: Secondary | ICD-10-CM | POA: Diagnosis not present

## 2023-01-28 DIAGNOSIS — D509 Iron deficiency anemia, unspecified: Secondary | ICD-10-CM | POA: Diagnosis not present

## 2023-01-28 DIAGNOSIS — K2950 Unspecified chronic gastritis without bleeding: Secondary | ICD-10-CM | POA: Diagnosis not present

## 2023-01-28 DIAGNOSIS — D128 Benign neoplasm of rectum: Secondary | ICD-10-CM | POA: Diagnosis not present

## 2023-01-28 DIAGNOSIS — I10 Essential (primary) hypertension: Secondary | ICD-10-CM | POA: Diagnosis not present

## 2023-01-28 DIAGNOSIS — G4730 Sleep apnea, unspecified: Secondary | ICD-10-CM | POA: Diagnosis not present

## 2023-02-02 DIAGNOSIS — G4733 Obstructive sleep apnea (adult) (pediatric): Secondary | ICD-10-CM | POA: Diagnosis not present

## 2023-02-26 DIAGNOSIS — F314 Bipolar disorder, current episode depressed, severe, without psychotic features: Secondary | ICD-10-CM | POA: Diagnosis not present

## 2023-03-20 DIAGNOSIS — N760 Acute vaginitis: Secondary | ICD-10-CM | POA: Diagnosis not present

## 2023-03-24 DIAGNOSIS — R5381 Other malaise: Secondary | ICD-10-CM | POA: Diagnosis not present

## 2023-03-24 DIAGNOSIS — U071 COVID-19: Secondary | ICD-10-CM | POA: Diagnosis not present

## 2023-03-24 DIAGNOSIS — M791 Myalgia, unspecified site: Secondary | ICD-10-CM | POA: Diagnosis not present

## 2023-03-24 DIAGNOSIS — R0981 Nasal congestion: Secondary | ICD-10-CM | POA: Diagnosis not present

## 2023-03-24 DIAGNOSIS — R519 Headache, unspecified: Secondary | ICD-10-CM | POA: Diagnosis not present

## 2023-03-24 DIAGNOSIS — R051 Acute cough: Secondary | ICD-10-CM | POA: Diagnosis not present

## 2023-04-18 DIAGNOSIS — R509 Fever, unspecified: Secondary | ICD-10-CM | POA: Diagnosis not present

## 2023-04-18 DIAGNOSIS — R051 Acute cough: Secondary | ICD-10-CM | POA: Diagnosis not present

## 2023-04-18 DIAGNOSIS — J019 Acute sinusitis, unspecified: Secondary | ICD-10-CM | POA: Diagnosis not present

## 2023-04-23 DIAGNOSIS — L219 Seborrheic dermatitis, unspecified: Secondary | ICD-10-CM | POA: Diagnosis not present

## 2023-04-23 DIAGNOSIS — L299 Pruritus, unspecified: Secondary | ICD-10-CM | POA: Diagnosis not present

## 2023-04-23 DIAGNOSIS — L820 Inflamed seborrheic keratosis: Secondary | ICD-10-CM | POA: Diagnosis not present

## 2023-04-27 DIAGNOSIS — R0602 Shortness of breath: Secondary | ICD-10-CM | POA: Diagnosis not present

## 2023-04-27 DIAGNOSIS — R5383 Other fatigue: Secondary | ICD-10-CM | POA: Diagnosis not present

## 2023-04-27 DIAGNOSIS — I252 Old myocardial infarction: Secondary | ICD-10-CM | POA: Diagnosis not present

## 2023-04-27 DIAGNOSIS — R918 Other nonspecific abnormal finding of lung field: Secondary | ICD-10-CM | POA: Diagnosis not present

## 2023-04-27 DIAGNOSIS — Z8701 Personal history of pneumonia (recurrent): Secondary | ICD-10-CM | POA: Diagnosis not present

## 2023-04-27 DIAGNOSIS — R0781 Pleurodynia: Secondary | ICD-10-CM | POA: Diagnosis not present

## 2023-04-27 DIAGNOSIS — R9431 Abnormal electrocardiogram [ECG] [EKG]: Secondary | ICD-10-CM | POA: Diagnosis not present

## 2023-04-27 DIAGNOSIS — R079 Chest pain, unspecified: Secondary | ICD-10-CM | POA: Diagnosis not present

## 2023-04-27 DIAGNOSIS — R000 Tachycardia, unspecified: Secondary | ICD-10-CM | POA: Diagnosis not present

## 2023-04-27 DIAGNOSIS — R531 Weakness: Secondary | ICD-10-CM | POA: Diagnosis not present

## 2023-04-27 DIAGNOSIS — E1165 Type 2 diabetes mellitus with hyperglycemia: Secondary | ICD-10-CM | POA: Diagnosis not present

## 2023-04-28 DIAGNOSIS — R000 Tachycardia, unspecified: Secondary | ICD-10-CM | POA: Diagnosis not present

## 2023-04-28 DIAGNOSIS — R079 Chest pain, unspecified: Secondary | ICD-10-CM | POA: Diagnosis not present

## 2023-04-30 DIAGNOSIS — Z7985 Long-term (current) use of injectable non-insulin antidiabetic drugs: Secondary | ICD-10-CM | POA: Diagnosis not present

## 2023-04-30 DIAGNOSIS — J189 Pneumonia, unspecified organism: Secondary | ICD-10-CM | POA: Diagnosis not present

## 2023-04-30 DIAGNOSIS — E1122 Type 2 diabetes mellitus with diabetic chronic kidney disease: Secondary | ICD-10-CM | POA: Diagnosis not present

## 2023-04-30 DIAGNOSIS — Z7984 Long term (current) use of oral hypoglycemic drugs: Secondary | ICD-10-CM | POA: Diagnosis not present

## 2023-04-30 DIAGNOSIS — N1831 Chronic kidney disease, stage 3a: Secondary | ICD-10-CM | POA: Diagnosis not present

## 2023-05-29 DIAGNOSIS — F314 Bipolar disorder, current episode depressed, severe, without psychotic features: Secondary | ICD-10-CM | POA: Diagnosis not present

## 2023-06-01 DIAGNOSIS — G4733 Obstructive sleep apnea (adult) (pediatric): Secondary | ICD-10-CM | POA: Diagnosis not present

## 2023-07-02 DIAGNOSIS — G4733 Obstructive sleep apnea (adult) (pediatric): Secondary | ICD-10-CM | POA: Diagnosis not present

## 2023-07-07 DIAGNOSIS — R21 Rash and other nonspecific skin eruption: Secondary | ICD-10-CM | POA: Diagnosis not present

## 2023-07-11 DIAGNOSIS — R21 Rash and other nonspecific skin eruption: Secondary | ICD-10-CM | POA: Diagnosis not present

## 2023-07-27 DIAGNOSIS — N1831 Chronic kidney disease, stage 3a: Secondary | ICD-10-CM | POA: Diagnosis not present

## 2023-07-27 DIAGNOSIS — E1122 Type 2 diabetes mellitus with diabetic chronic kidney disease: Secondary | ICD-10-CM | POA: Diagnosis not present

## 2023-07-31 DIAGNOSIS — N1831 Chronic kidney disease, stage 3a: Secondary | ICD-10-CM | POA: Diagnosis not present

## 2023-07-31 DIAGNOSIS — R479 Unspecified speech disturbances: Secondary | ICD-10-CM | POA: Diagnosis not present

## 2023-07-31 DIAGNOSIS — R42 Dizziness and giddiness: Secondary | ICD-10-CM | POA: Diagnosis not present

## 2023-07-31 DIAGNOSIS — I129 Hypertensive chronic kidney disease with stage 1 through stage 4 chronic kidney disease, or unspecified chronic kidney disease: Secondary | ICD-10-CM | POA: Diagnosis not present

## 2023-07-31 DIAGNOSIS — E1122 Type 2 diabetes mellitus with diabetic chronic kidney disease: Secondary | ICD-10-CM | POA: Diagnosis not present

## 2023-07-31 DIAGNOSIS — R000 Tachycardia, unspecified: Secondary | ICD-10-CM | POA: Diagnosis not present

## 2023-07-31 DIAGNOSIS — E782 Mixed hyperlipidemia: Secondary | ICD-10-CM | POA: Diagnosis not present

## 2023-08-01 DIAGNOSIS — G4733 Obstructive sleep apnea (adult) (pediatric): Secondary | ICD-10-CM | POA: Diagnosis not present
